# Patient Record
Sex: Male | Born: 1945 | Race: White | Hispanic: No | Marital: Married | Smoking: Never smoker
Health system: Southern US, Community
[De-identification: ages and names within clinical notes are randomized; demographics above are authoritative.]

## PROBLEM LIST (undated history)

## (undated) DIAGNOSIS — K635 Polyp of colon: Secondary | ICD-10-CM

## (undated) DIAGNOSIS — K219 Gastro-esophageal reflux disease without esophagitis: Secondary | ICD-10-CM

## (undated) DIAGNOSIS — E785 Hyperlipidemia, unspecified: Secondary | ICD-10-CM

## (undated) DIAGNOSIS — K449 Diaphragmatic hernia without obstruction or gangrene: Secondary | ICD-10-CM

## (undated) DIAGNOSIS — I7121 Aneurysm of the ascending aorta, without rupture: Secondary | ICD-10-CM

## (undated) DIAGNOSIS — I2699 Other pulmonary embolism without acute cor pulmonale: Secondary | ICD-10-CM

## (undated) DIAGNOSIS — R7303 Prediabetes: Secondary | ICD-10-CM

## (undated) DIAGNOSIS — I712 Thoracic aortic aneurysm, without rupture: Secondary | ICD-10-CM

## (undated) DIAGNOSIS — K4090 Unilateral inguinal hernia, without obstruction or gangrene, not specified as recurrent: Secondary | ICD-10-CM

## (undated) HISTORY — PX: HERNIA REPAIR: SHX51

## (undated) HISTORY — DX: Polyp of colon: K63.5

## (undated) HISTORY — DX: Other pulmonary embolism without acute cor pulmonale: I26.99

## (undated) HISTORY — DX: Aneurysm of the ascending aorta, without rupture: I71.21

## (undated) HISTORY — DX: Prediabetes: R73.03

## (undated) HISTORY — DX: Thoracic aortic aneurysm, without rupture: I71.2

## (undated) HISTORY — DX: Hyperlipidemia, unspecified: E78.5

---

## 1998-10-11 ENCOUNTER — Encounter: Payer: Self-pay | Admitting: Emergency Medicine

## 1998-10-11 ENCOUNTER — Emergency Department (HOSPITAL_COMMUNITY): Admission: EM | Admit: 1998-10-11 | Discharge: 1998-10-11 | Payer: Self-pay | Admitting: Emergency Medicine

## 2001-01-11 ENCOUNTER — Ambulatory Visit (HOSPITAL_COMMUNITY): Admission: RE | Admit: 2001-01-11 | Discharge: 2001-01-11 | Payer: Self-pay | Admitting: Gastroenterology

## 2001-01-11 ENCOUNTER — Encounter: Payer: Self-pay | Admitting: Gastroenterology

## 2004-06-09 ENCOUNTER — Other Ambulatory Visit: Payer: Self-pay

## 2005-03-19 ENCOUNTER — Encounter: Admission: RE | Admit: 2005-03-19 | Discharge: 2005-03-19 | Payer: Self-pay | Admitting: Family Medicine

## 2005-04-23 ENCOUNTER — Ambulatory Visit: Payer: Self-pay

## 2005-08-23 ENCOUNTER — Encounter: Admission: RE | Admit: 2005-08-23 | Discharge: 2005-08-23 | Payer: Self-pay | Admitting: Gastroenterology

## 2006-09-21 ENCOUNTER — Ambulatory Visit (HOSPITAL_COMMUNITY): Admission: RE | Admit: 2006-09-21 | Discharge: 2006-09-21 | Payer: Self-pay | Admitting: Surgery

## 2006-12-20 ENCOUNTER — Ambulatory Visit: Payer: Self-pay | Admitting: Cardiology

## 2006-12-30 ENCOUNTER — Ambulatory Visit: Payer: Self-pay

## 2007-01-17 ENCOUNTER — Ambulatory Visit: Payer: Self-pay | Admitting: Cardiology

## 2007-03-28 ENCOUNTER — Encounter: Admission: RE | Admit: 2007-03-28 | Discharge: 2007-03-28 | Payer: Self-pay | Admitting: Gastroenterology

## 2008-01-05 ENCOUNTER — Encounter: Admission: RE | Admit: 2008-01-05 | Discharge: 2008-01-05 | Payer: Self-pay | Admitting: Family Medicine

## 2009-12-12 DIAGNOSIS — R002 Palpitations: Secondary | ICD-10-CM | POA: Insufficient documentation

## 2009-12-12 DIAGNOSIS — R079 Chest pain, unspecified: Secondary | ICD-10-CM | POA: Insufficient documentation

## 2009-12-16 ENCOUNTER — Ambulatory Visit: Payer: Self-pay | Admitting: Cardiovascular Disease

## 2009-12-16 DIAGNOSIS — I739 Peripheral vascular disease, unspecified: Secondary | ICD-10-CM | POA: Insufficient documentation

## 2009-12-23 ENCOUNTER — Ambulatory Visit: Payer: Self-pay

## 2009-12-23 ENCOUNTER — Encounter: Payer: Self-pay | Admitting: Cardiovascular Disease

## 2010-12-15 NOTE — Assessment & Plan Note (Signed)
Summary: ec3/old Dr Diona Browner pt/jss   CC:  chest pressure also pt states he has pressure in left arm and one of his fingers he states has no circulation.  History of Present Illness: Howard Davenport is seen today to R/O vascular disease.  He has had numbness and pallor in the left index finger.  It is made worse with arms pulled in and cold.  He wears a glove to improve it.  He is a nonsmoker and has not had overt trauma.  However he was a Curator and plays the guitar and had stressed the palm of his hands and finger tips with these activities  The pallor and numbness are worse over the last 6 months.  He denies other vascular disease.  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 1/2 Tab By Mouth Once Daily 2)  Vitamin E 400 Unit Caps (Vitamin E) .Marland Kitchen.. 1 Tab By Mouth Once Daily 3)  Beta Sitosterol 40 % Powd (Beta Sitosterol) .Marland Kitchen.. 1 Tab By Mouth Once Daily 4)  Alfalfa 250 Mg Tabs (Alfalfa) .Marland Kitchen.. 1 Tab Op Once Daily 5)  Nifedipine 30 Mg Xr24h-Tab (Nifedipine) .... One Tablet By Mouth Once Daily  Allergies (verified): No Known Drug Allergies  Past History:  Past Medical History: Last updated: 12/12/2009 Current Problems:  CHEST PAIN (ICD-786.50) Patient of [facility].  Normal myuovue in 2008 PALPITATIONS (ICD-785.1)   previous history of erosive esophagitis as well as esophageal dilatation back in 2002  Past Surgical History: Last updated: 12/12/2009  status post herniorrhaphy.  Family History: Last updated: 12/12/2009   Noncontributory for premature cardiovascular disease  based on available information.  Social History: Last updated: 12/12/2009  Patient is married and he has 2 children.  He works for  [employer].  Denies any active tobacco  or alcohol use history.  Review of Systems       Denies fever, malais, weight loss, blurry vision, decreased visual acuity, cough, sputum, SOB, hemoptysis, pleuritic pain, palpitaitons, heartburn, abdominal pain, melena, lower extremity edema, claudication, or rash.   Vital Signs:  Patient profile:   65 year old male Height:      71 inches Weight:      215 pounds BMI:     30.09 Pulse rate:   64 / minute Resp:     12 per minute BP sitting:   124 / 79  (left arm)  Vitals Entered By: Kem Parkinson (December 16, 2009 10:18 AM)  Physical Exam  General:  Affect appropriate Healthy:  appears stated age HEENT: normal Neck supple with no adenopathy JVP normal no bruits no thyromegaly Lungs clear with no wheezing and good diaphragmatic motion Heart:  S1/S2 no murmur,rub, gallop or click PMI normal Abdomen: benighn, BS positve, no tenderness, no AAA no bruit.  No HSM or HJR Distal pulses intact with no bruits No edema Neuro non-focal Skin warm and dry    Impression & Recommendations:  Problem # 1:  PVD (ICD-443.9) Possible Raynauds.  Upper extremity duplex with position to R/O thoracic outlet syndrome.  Trial of Nifedipine XR  Problem # 2:  CHEST PAIN (ICD-786.50) Non recurrent normal ECG in office today His updated medication list for this problem includes:    Nifedipine 30 Mg Xr24h-tab (  Nifedipine) ..... One tablet by mouth once daily  Other Orders: Arterial Duplex Upper Extremity (Arterial Duplex Up )  Patient Instructions: 1)  Your physician recommends that you schedule a follow-up appointment in: AS NEEDED  2)  Your physician has recommended you make the following change in your medication: START NIFEDIPINE 30MG  ONE TABLET ONCE DAILY 3)  Your physician has requested that you have a lower or upper extremity arterial duplex.  This test is an ultrasound of the arteries in the legs or arms.  It looks at arterial blood flow in the legs and arms.  Allow one hour for Lower  and Upper Arterial scans. There are no restrictions or special instructions. Prescriptions: NIFEDIPINE 30 MG XR24H-TAB (NIFEDIPINE) ONE TABLET BY MOUTH ONCE DAILY  #30 x 12   Entered by:   Deliah Goody, RN   Authorized by:   Colon Branch, MD, [facility]   Signed by:   Deliah Goody, RN on 12/16/2009   Method used:   Electronically to        [facility]  [address] [id number]       [address]       [place], [place]  [address]       Ph: [phone]       Fax: [phone]   RxID:   [id number]    EKG Report  Procedure date:  12/16/2009  Findings:      NSR 64 Normal ECG

## 2011-04-02 NOTE — Op Note (Signed)
NAME:  Howard Davenport, Howard Davenport                 ACCOUNT NO.:  [id number]   MEDICAL RECORD NO.:  [id number]          PATIENT TYPE:  AMB   LOCATION:  SDS                          FACILITY:  [facility]   PHYSICIAN:  Thomas A. Cornett, M.D.DATE OF BIRTH:  05-18-1946   DATE OF PROCEDURE:  09/21/2006  DATE OF DISCHARGE:                                 OPERATIVE REPORT   PREOPERATIVE DIAGNOSIS:  Right inguinal hernia.   POSTOPERATIVE DIAGNOSIS:  Direct right inguinal hernia.   PROCEDURE:  Right inguinal hernia repair with mesh.   SURGEON:  Thomas Cornett.   ANESTHESIA:  LMA with 20 mL of 0.25% plain Sensorcaine.   ESTIMATED BLOOD LOSS:  10 mL.   SPECIMENS:  None.   INDICATIONS FOR PROCEDURE:  The patient is a 65 year old male with an  enlarging right inguinal hernia that was becoming symptomatic.  He wished to  have it repaired.  He presents today for that after discussion of the  procedure, complications, risks, and long-term outcomes and expectations.   DESCRIPTION OF PROCEDURE:  The patient was brought to the operating room,  placed supine.  After induction of LMA anesthesia, the right inguinal region  was prepped and draped in sterile fashion.  I infiltrated the skin with  0.25% plain Sensorcaine in the right inguinal crease and made an oblique  incision over the right inguinal canal.  Dissection was carried down through  Scarpa fascia.  The superficial epigastric vessels were ligated with 2-0  Vicryl.  Once we got down to the aponeurosis of the external oblique, I  infiltrated this with 0.25% Sensorcaine.  This was then opened in the  direction of its fibers toward the external ring.  Once we opened this, I  was able to encircle the cord structures with half-inch Penrose drain.  There was a direct hernia and I was able to dissect this off the cord.  There was also a large lipoma which I dissected off the cord as well.  I  went ahead and just reduced the lipoma back in the preperitoneal space.   The  floor of the inguinal canal was for the most part gone.  A large Prolene  hernia system was brought on the field.  I was able to sweep under the  muscles in the preperitoneal space and create a space for the large Prolene  hernia system inner leaflet.  This was then placed.  The mesh was then swept  down to the pubic tubercle superiorly and then laterally down to the  shelving edge of the inguinal ligament.  Once this was done, I secured the  mesh to the transversus abdominis muscle with a zero Vicryl at the connector  piece.  Then I tacked the onlay piece down to the pubic tubercle with a zero  Vicryl.  Number one Novofils were used to secure the onlay piece of mesh  circumferentially to the shelving edge of inguinal ligament and over to the  contouring tendon.  There was a small slit cut for the cord structures and I  sutured the mesh around the cord structures with #1  Novofil.  The  ilioinguinal nerve was sacrificed to keep it off the mesh since it lied in a  position.  I was concerned for possible postop neuralgia.  Also, the medial  branch of the iliohypogastric nerve as well lie directly in contact with the  mesh.  This was divided for this reason as well.  I was able to insert the  tip of my fifth digit into the new internal ring.  Irrigation was used and  suctioned out.  2-0 Vicryl was used to close the aponeurosis of the external  oblique in a  running fashion.  I used a 2-0 Vicryl to approximate Scarpa fascia and 4-0  Monocryl in a subcuticular fashion for the skin.  Steri-Strips and dry  dressings were applied.  All final counts of sponge, needle and instruments  were found to be correct for this portion of the case.  The patient was  awoke and taken to recovery in satisfactory condition.      Thomas A. Cornett, M.D.  Electronically Signed     TAC/MEDQ  D:  09/21/2006  T:  09/21/2006  Job:  [id number]   cc:   Ernestina Penna, M.D.

## 2011-04-02 NOTE — Assessment & Plan Note (Signed)
[facility]                            CARDIOLOGY OFFICE NOTE   NAME:CROSSFitz, Matsuo                        MRN:          [id number]  DATE:01/17/2007                            DOB:          1946/04/15    REASON FOR VISIT:  Follow up stress testing.   HISTORY OF PRESENT ILLNESS:  I saw Mr. Fesler back in early February.  His history is detailed in my previous note.  He had been experiencing  some symptoms of a general feeling of coldness in his left arm with  activity, and sometimes in the cold, as well as atypical chest pain, and  a single episode of left shoulder discomfort.  His resting  electrocardiogram was normal, and he had a prior history of reportedly  reassuring exercise treadmill testing in 2006.  I discussed diagnostic  options with him, including both invasive and noninvasive techniques,  and he elected to proceed with an exercise Myoview, which we performed  on the 15th of February.  This study showed no significant reversible  perfusion defects to suggest focal ischemia with a normal transient  ischemic dilatation ratio of 0.95.  His ejection fraction was 62%.  On  review of his electrocardiographic tracings, however, he did have  equivocal ST segment changes, and a 4-beat run of nonsustained  ventricular tachycardia near peak.  I discussed these results with him  in detail today.   He tells me that he has had trouble with palpitations in the past when  he drinks tea, but no history of dizziness or syncope.  He states that  he has been drinking more tea lately and has been having some  palpitations, wondering whether the arrhythmia that we saw was related.  I explained to him that this is certainly possible, although it can  sometimes be seen in the setting of ischemic heart disease.  I also  discussed the fact that his Myoview could have potentially missed some  underlying coronary atherosclerosis, and that confirmatory testing via a  cardiac catheterization would be a consideration.  At this point, he  considered the matter, and was not interested in any additional cardiac  studies, including a cardiac catheterization or even an event recorder  given his palpitations.  He stated that he was most comfortable with  simple observation, and that he would follow up with his primary care  Ricard Faulkner.  I did ask him to inform us if he developed any worsening  symptoms or changed his mind.   ALLERGIES:  NO KNOWN DRUG ALLERGIES.   PRESENT MEDICATIONS:  1. Prilosec 20 mg p.o. daily.  2. Multivitamin 1 p.o. daily.  3. Glucosamine as directed.   REVIEW OF SYSTEMS:  As discussed in history of present illness.   EXAMINATION:  Blood pressure 126/54.  Heart rate is 67.  Weight is 213  pounds.  Patient is comfortable, in no acute distress.  No significant change in baseline examination.   IMPRESSION AND RECOMMENDATIONS:  1. Previous symptoms as discussed and outlined above.  As yet,      etiology is not certain.  He does have a history of erosive      esophagitis, which certainly may be related.  He also wondered      whether Raynaud's could be considered, although he is not      describing any symmetrical symptoms in both extremities, and for      that matter, no specific changes with cold at rest or color changes      in the hands.  I discussed the results of his stress testing in      detail, and still have some concerns based on his 4-beat      nonsustained ventricular tachycardia, although his perfusion      imaging was fairly reassuring.  He is not interested in any further      testing at this point, and is most comfortable with observation.  I      have asked him to let us know if he has a change in symptoms, or      would like to proceed with further evaluation.  I think an event      recorder would be reasonable, given his history of palpitations,      and his electrocardiographic findings.  One could also argue for       proceeding with a cardiac catheterization to clearly define the      coronary anatomy, and settle the issue more completely.  We will be      available if he wishes to pursue this further.  2. Otherwise, continue followup with [facility].     Jonelle Sidle, MD  Electronically Signed    SGM/MedQ  DD: 01/17/2007  DT: 01/17/2007  Job #: [id number]

## 2011-04-02 NOTE — Assessment & Plan Note (Signed)
[facility]                            CARDIOLOGY OFFICE NOTE   NAME:CROSSSoua, Caltagirone                        MRN:          [id number]  DATE:12/20/2006                            DOB:          08-09-1946    REFERRING PHYSICIAN:  [facility]   REASON FOR CONSULTATION:  Left arm discomfort and chest pain.   HISTORY OF PRESENT ILLNESS:  Mr. Sagar is a pleasant 65 year old male  with no reported cardiovascular disease.  I see a previous history of  erosive esophagitis as well as esophageal dilatation back in 2002, and  prior cardiac stress testing via an exercise treadmill test in 2006,  which was negative or ischemia.  He states that he typically walks with  his wife approximately 3 or 4 miles at a time every other day, and  typically tolerates this well.  Within the last month, he has  experienced an unusual feeling of coldness in his left arm and hand  sometimes when he walks.  He also states that he had an episode of left  shoulder discomfort at one point that he cannot relate to any specific  event or injury.  He has had an unusual focal pain his left chest as if  a thumb is pushed into his chest, and this is also a new symptom for  him.  He became more concerned and wanted to be evaluated further.  Otherwise, he reports no major symptoms with exertion.  No limiting  dyspnea.  No palpitations.  No syncope.   His resting electrocardiogram today is normal, showing sinus rhythm at  67 beats per minute.   ALLERGIES:  NO KNOWN DRUG ALLERGIES.   PRESENT MEDICATIONS:  1. Prilosec 20 mg p.o. daily.  2. Multivitamin.  3. Glucosamine.   PAST MEDICAL HISTORY:  Is as outlined in the history of present illness.  He states that he is status post herniorrhaphy.   REVIEW OF SYSTEMS:  As described in history of present illness.  He is  not having any problems with dysphagia, although this was a problem in  the past.  He does note some right upper  quadrant discomfort at times  when he eats fatty foods.  He has no claudication.  Denies any lower  extremity edema.  No has no orthopnea or PND.   SOCIAL HISTORY:  Patient is married and he has 2 children.  He works for  [employer].  Denies any active tobacco or alcohol use history.   FAMILY HISTORY:  Noncontributory for premature cardiovascular disease  based on available information.   EXAMINATION:  Blood pressure is 122/80.  Heart rate is 67.  Weight is  212 pounds.  The patient is comfortable.  Normally nourished.  In no acute distress.  HEENT:  Conjunctivae is normal.  Oropharynx is clear.  NECK:  Supple without obvious jugular venous pressure or bruits.  No  thyromegaly is noted.  LUNGS:  Clear without labored breathing at rest.  CARDIAC:  Exam reveals a regular rate and rhythm without loud murmur,  S3, gallop or pericardial rub.  ABDOMEN:  Soft and non-tender.  Normoactive bowel sounds.  No guarding  on today's examination.  EXTREMITIES:  Show no pitting edema.  Distal pulses are 2+ including  radials bilaterally.  SKIN:  Warm and dry.  MUSCULOSKELETAL:  No kyphosis is noted.  NEUROLOGIC/PSYCHIATRIC:  Patient is alert and oriented x3.   IMPRESSION AND RECOMMENDATIONS:  1. Symptom complex as discussed above including a feeling of a      relative coldness of the left arm with activity, atypical chest      pain and an episode of left shoulder discomfort.  The resting      electrocardiogram is normal.  I discussed with Mr. Rinck today      followup ischemic evaluation.  We discussed both noninvasive and      invasive techniques, and at this point, he has elected to proceed      with an exercise Myoview.  I will then have him follow up in the      office to discuss the results, and we can proceed from there.  2. Further plans to follow.     Jonelle Sidle, MD  Electronically Signed    SGM/MedQ  DD: 12/20/2006  DT: 12/20/2006  Job #: [id number]   cc:    [facility]

## 2013-03-29 ENCOUNTER — Ambulatory Visit: Payer: Medicare Other | Admitting: Physician Assistant

## 2013-03-30 ENCOUNTER — Ambulatory Visit (INDEPENDENT_AMBULATORY_CARE_PROVIDER_SITE_OTHER): Payer: Medicare Other | Admitting: Physician Assistant

## 2013-03-30 ENCOUNTER — Encounter: Payer: Self-pay | Admitting: Physician Assistant

## 2013-03-30 VITALS — BP 126/90 | HR 68 | Temp 97.3°F | Resp 18 | Wt 212.0 lb

## 2013-03-30 DIAGNOSIS — T148 Other injury of unspecified body region: Secondary | ICD-10-CM

## 2013-03-30 DIAGNOSIS — W57XXXA Bitten or stung by nonvenomous insect and other nonvenomous arthropods, initial encounter: Secondary | ICD-10-CM

## 2013-03-30 MED ORDER — DOXYCYCLINE HYCLATE 100 MG PO TABS: 100.0000 mg | ORAL_TABLET | Freq: Two times a day (BID) | ORAL | Status: DC

## 2013-03-30 MED ORDER — DOXYCYCLINE HYCLATE 100 MG PO TABS: 100.0000 mg | ORAL_TABLET | Freq: Two times a day (BID) | ORAL | Status: AC

## 2013-03-30 NOTE — Progress Notes (Signed)
   Patient ID: Howard Davenport MRN: [id number], DOB: 04-29-1946, 67 y.o. Date of Encounter: 03/30/2013, 3:13 PM    Chief Complaint:  Chief Complaint  Patient presents with  . concern about reaction to tick bite  h/o RMSF     HPI: 67 y.o. year old white male is here for evaluation secondary to recent tic bites.  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 Last one was about 1- 1 1/2 weeks ago. That one caused a pink spot on his skin "the size of a quarter" at the site.  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 At that time, his only symptom was joint pains.  He is currently having joint pains. He says these may be to regular "aches and pains" but he wanted to make sur eit wasn't RMSF like before." He has had no other rash other than the pink area at site of tic (see above). He has had no fever. No malaise. No headache.   Home Meds: See attached medication section for any medications that were entered at today's visit. The computer does not put those onto this list.The following list is a list of meds entered prior to today's visit.   No current outpatient prescriptions on file prior to visit.   No current facility-administered medications on file prior to visit.    Allergies: No Known Allergies    Review of Systems: See HPI for pertinent ROS. All other ROS negative.    Physical Exam: Blood pressure 126/90, pulse 68, temperature 97.3 F (36.3 C), temperature source Oral, resp. rate 18, weight 212 lb (96.163 kg)., Body mass index is 29.58 kg/(m^2). General:Very Pleasant WM.  Appears in no acute distress. Neck: Supple. No thyromegaly. No lymphadenopathy. Lungs: Clear bilaterally to auscultation without wheezes, rales, or rhonchi. Breathing is unlabored. Heart: Regular rhythm. No murmurs, rubs, or gallops. Msk:  Strength and tone normal for age. Extremities/Skin: Warm and dry. Left Groin: one inch diameter area of light pink diffuse erythema. Not raised.  No central clearing/target lesion/bulls eye lesion. No other area of erythema or rash. Neuro: Alert and oriented X 3. Moves all extremities spontaneously. Gait is normal. CNII-XII grossly in tact. Psych:  Responds to questions appropriately with a normal affect.     ASSESSMENT AND PLAN:  67 y.o. year old male with  1. Tick bite Will check lab for Lyme, RMSF but will go ahead and start empiric treatment. He is experiencing same symptoms he had with prior RMSF. - B. burgdorfi antibodies by WB - Rocky mtn spotted fvr abs pnl(IgG+IgM) - doxycycline (VIBRA-TABS) 100 MG tablet; Take 1 tablet (100 mg total) by mouth 2 (two) times daily.  Dispense: 42 tablet; Refill: 0   Signed, [address] [place], [place], [facility] 03/30/2013 3:13 PM

## 2013-04-02 LAB — ROCKY MTN SPOTTED FVR ABS PNL(IGG+IGM): RMSF IgM: 0.37 IV

## 2013-08-23 ENCOUNTER — Ambulatory Visit (INDEPENDENT_AMBULATORY_CARE_PROVIDER_SITE_OTHER): Payer: Medicare Other | Admitting: *Deleted

## 2013-08-23 VITALS — Temp 98.7°F

## 2013-08-23 DIAGNOSIS — Z23 Encounter for immunization: Secondary | ICD-10-CM

## 2013-08-30 DIAGNOSIS — M25511 Pain in right shoulder: Secondary | ICD-10-CM | POA: Insufficient documentation

## 2013-12-22 ENCOUNTER — Encounter (HOSPITAL_COMMUNITY): Payer: Self-pay | Admitting: Emergency Medicine

## 2013-12-22 ENCOUNTER — Emergency Department (HOSPITAL_COMMUNITY)
Admission: EM | Admit: 2013-12-22 | Discharge: 2013-12-22 | Disposition: A | Discharge status: Home / Self Care | Payer: Medicare HMO | Attending: Emergency Medicine | Admitting: Emergency Medicine

## 2013-12-22 DIAGNOSIS — Z79899 Other long term (current) drug therapy: Secondary | ICD-10-CM | POA: Insufficient documentation

## 2013-12-22 DIAGNOSIS — R110 Nausea: Secondary | ICD-10-CM | POA: Insufficient documentation

## 2013-12-22 DIAGNOSIS — Z8719 Personal history of other diseases of the digestive system: Secondary | ICD-10-CM | POA: Insufficient documentation

## 2013-12-22 DIAGNOSIS — R55 Syncope and collapse: Secondary | ICD-10-CM | POA: Insufficient documentation

## 2013-12-22 HISTORY — DX: Unilateral inguinal hernia, without obstruction or gangrene, not specified as recurrent: K40.90

## 2013-12-22 HISTORY — DX: Diaphragmatic hernia without obstruction or gangrene: K44.9

## 2013-12-22 LAB — CBC WITH DIFFERENTIAL/PLATELET
BASOS PCT: 0 % (ref 0–1)
Basophils Absolute: 0 10*3/uL (ref 0.0–0.1)
Eosinophils Absolute: 0.1 10*3/uL (ref 0.0–0.7)
Eosinophils Relative: 2 % (ref 0–5)
HEMATOCRIT: 42.9 % (ref 39.0–52.0)
HEMOGLOBIN: 15.2 g/dL (ref 13.0–17.0)
Lymphocytes Relative: 42 % (ref 12–46)
Lymphs Abs: 2.9 10*3/uL (ref 0.7–4.0)
MCH: 31.5 pg (ref 26.0–34.0)
MCHC: 35.4 g/dL (ref 30.0–36.0)
MCV: 88.8 fL (ref 78.0–100.0)
MONO ABS: 0.6 10*3/uL (ref 0.1–1.0)
MONOS PCT: 8 % (ref 3–12)
NEUTROS ABS: 3.3 10*3/uL (ref 1.7–7.7)
Neutrophils Relative %: 48 % (ref 43–77)
Platelets: 180 10*3/uL (ref 150–400)
RBC: 4.83 MIL/uL (ref 4.22–5.81)
RDW: 12.8 % (ref 11.5–15.5)
WBC: 6.9 10*3/uL (ref 4.0–10.5)

## 2013-12-22 LAB — BASIC METABOLIC PANEL
BUN: 12 mg/dL (ref 6–23)
CHLORIDE: 98 meq/L (ref 96–112)
CO2: 26 meq/L (ref 19–32)
Calcium: 8.8 mg/dL (ref 8.4–10.5)
Creatinine, Ser: 0.89 mg/dL (ref 0.50–1.35)
GFR calc non Af Amer: 87 mL/min — ABNORMAL LOW (ref >90)
GLUCOSE: 150 mg/dL — AB (ref 70–99)
POTASSIUM: 3.8 meq/L (ref 3.7–5.3)
SODIUM: 138 meq/L (ref 137–147)

## 2013-12-22 LAB — POCT I-STAT TROPONIN I
TROPONIN I, POC: 0 ng/mL (ref 0.00–0.08)
TROPONIN I, POC: 0 ng/mL (ref 0.00–0.08)
Troponin i, poc: 0.01 ng/mL (ref 0.00–0.08)

## 2013-12-22 LAB — GLUCOSE, CAPILLARY: GLUCOSE-CAPILLARY: 142 mg/dL — AB (ref 70–99)

## 2013-12-22 LAB — D-DIMER, QUANTITATIVE: D-Dimer, Quant: <0.27 ug/mL-FEU (ref 0.00–0.48)

## 2013-12-22 NOTE — ED Notes (Signed)
Pt came into ED.  Stated he thought he was having heart attack.  Pt then became weak, red faced and stated that he was about to pass out.  Pt placed in chair and taken back to [facility]

## 2013-12-22 NOTE — ED Notes (Signed)
Pt states that he has been "feeling strange" x 3 days.  Was at lunch with his family this afternoon and told his family, "If anything happens to me, I need you to know that I feel very strange". States that he feels nauseated and feels like he "is about to go out".  Denies any pain.

## 2013-12-22 NOTE — ED Provider Notes (Signed)
CSN: [id number]     Arrival date & time 12/22/13  1334 History   First MD Initiated Contact with Patient 12/22/13 1349     Chief Complaint  Patient presents with  . Nausea  . Near Syncope   (Consider location/radiation/quality/duration/timing/severity/associated sxs/prior Treatment) HPI 68 year old healthy male presents with 30-45 minutes of waxing and waning pre-syncope, he states for several minutes at a time he will feel generally weak lightheaded slight nausea slight sweats and feels that he is going to faint without chest pain shortness of breath palpitations syncope amnesia headache or change in speech vision swallowing or understanding, he also is no focal or lateralizing weakness numbness or incoordination, there is no trauma or headache, he has a history of good exercise tolerance and walks briskly over 2 miles at a time without difficulty recently, he does not have any history of recurrent syncope and is only episode of syncope in the past was when he had vertigo years ago, he does not have any history of documented coronary artery disease or heart failure. There is no treatment prior to arrival. He has no fever cough or shortness of breath however he states over the last few days he has a sensation that he cannot take a deep breath he noticed painless and does not disrupted his activity in any way. Has no history of blood clots or cancer. He is no recent immobilization. Past Medical History  Diagnosis Date  . Hiatal hernia   . Inguinal hernia    Past Surgical History  Procedure Laterality Date  . Hernia repair     History reviewed. No pertinent family history. History  Substance Use Topics  . Smoking status: Never Smoker   . Smokeless tobacco: Not on file  . Alcohol Use: No    Review of Systems 10 Systems reviewed and are negative for acute change except as noted in the HPI. Allergies  Review of patient's allergies indicates no known allergies.  Home Medications   Current  Outpatient Rx  Name  Route  Sig  Dispense  Refill  . lansoprazole (PREVACID) 15 MG capsule   Oral   Take 15 mg by mouth daily at 12 noon.         Marland Kitchen omega-3 acid ethyl esters (LOVAZA) 1 G capsule   Oral   Take 1 g by mouth daily.         . Probiotic Product (PROBIOTIC DAILY PO)   Oral   Take 1 tablet by mouth daily.         . vitamin E 400 UNIT capsule   Oral   Take 400 Units by mouth daily.          BP 129/72  Pulse 70  Temp(Src) 98.2 F (36.8 C) (Oral)  Resp 16  SpO2 99% Physical Exam  Nursing note and vitals reviewed. Constitutional:  Awake, alert, nontoxic appearance.  HENT:  Head: Atraumatic.  Eyes: Right eye exhibits no discharge. Left eye exhibits no discharge.  Neck: Neck supple.  Cardiovascular: Normal rate and regular rhythm.   No murmur heard. Pulmonary/Chest: Effort normal and breath sounds normal. No respiratory distress. He has no wheezes. He has no rales. He exhibits no tenderness.  Abdominal: Soft. Bowel sounds are normal. He exhibits no distension and no mass. There is no tenderness. There is no rebound and no guarding.  Musculoskeletal: He exhibits no edema and no tenderness.  Baseline ROM, no obvious new focal weakness.  Neurological: He is alert.  Mental status and motor  strength appears baseline for patient and situation.  Skin: No rash noted.  Psychiatric: He has a normal mood and affect.    ED Course  Procedures (including critical care time) Feel Pt low risk for cardiac syncope and low risk for PE. Doubt ACS, CVA, SAH, SBI, aortic dissection.  Pt stable in ED with no significant deterioration in condition.Patient / Family / Caregiver understand and agree with initial ED impression and plan with expectations set for ED visit.Await repeat troponins. [place] Reviewed  GLUCOSE, CAPILLARY - Abnormal; Notable for the following:    Glucose-Capillary 142 (*)    All other components within normal limits  BASIC METABOLIC PANEL  - Abnormal; Notable for the following:    Glucose, Bld 150 (*)    GFR calc non Af Amer 87 (*)    All other components within normal limits  CBC WITH DIFFERENTIAL  D-DIMER, QUANTITATIVE  POCT I-STAT TROPONIN I  POCT I-STAT TROPONIN I   Imaging Review No results found.  EKG Interpretation    Date/Time:  Saturday December 22 2013 14:23:45 EST Ventricular Rate:  68 PR Interval:  212 QRS Duration: 100 QT Interval:  396 QTC Calculation: 421 R Axis:   52 Text Interpretation:  Sinus rhythm Borderline prolonged PR interval Low voltage, precordial leads Consider anterior infarct Baseline wander in lead(s) V3 No significant change since last tracing Confirmed by [facility]  MD, Leilan Bochenek ([id number]) on 12/22/2013 2:31:29 PM            MDM   1. Near syncope    Dispo pending.    Babette Relic, MD 12/22/13 2018

## 2013-12-22 NOTE — Discharge Instructions (Signed)
Your caregiver has seen you today because you are having problems with feelings of weakness, dizziness, and/or fatigue. Weakness has many different causes, some of which are common and others are very rare. Your caregiver has considered some of the most common causes of weakness and feels it is safe for you to go home and be observed. Not every illness or injury can be identified during an emergency department visit, thus follow-up with your primary healthcare provider is important. Medical conditions can also worsen, so it is also important to return immediately as directed below, or if you have other serious concerns develop. RETURN IMMEDIATELY IF you develop new shortness of breath, chest pain, fever, have difficulty moving parts of your body (new weakness, numbness, or incoordination), sudden change in speech, vision, swallowing, or understanding, faint or develop new dizziness, severe headache, become poorly responsive or have an altered mental status compared to baseline for you, new rash, abdominal pain, or bloody stools,  Return sooner also if you develop new problems for which you have not talked to your caregiver but you feel may be emergency medical conditions, or are unable to be cared for safely at home.  

## 2013-12-26 ENCOUNTER — Encounter: Payer: Self-pay | Admitting: Family Medicine

## 2013-12-26 ENCOUNTER — Ambulatory Visit (INDEPENDENT_AMBULATORY_CARE_PROVIDER_SITE_OTHER): Payer: Commercial Managed Care - HMO | Admitting: Family Medicine

## 2013-12-26 VITALS — BP 108/70 | HR 78 | Temp 98.3°F | Resp 18 | Ht 72.0 in | Wt 212.0 lb

## 2013-12-26 DIAGNOSIS — R55 Syncope and collapse: Secondary | ICD-10-CM

## 2013-12-26 NOTE — Progress Notes (Signed)
Subjective:    Patient ID: Howard Davenport, male    DOB: [birth year], 68 y.o.   MRN: [id number]  HPI Patient went to the emergency room 2/7 with near syncope.  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 He was not doing strenuous work. He had eaten normal meals. He went inside for supper and felt unsteady.  When he sat down at the table he began to feel hot and flushed. He broke out in a cold sweat. At that time he felt like he was going to pass out. He denied any chest pain, shortness of breath, dyspnea on exertion. He had no syncopal episode. He had no seizure-like activity. His wife took of the emergency room were he resolves the pain is laying in route. When he stood to get out of the car he again felt lightheaded and near syncopal. In the emergency room workup included 8 hours of monitoring on telemetry which was benign, and EKG which I reviewed today in clinic, normal troponins, CBC, and CMP. Patient's evaluation in the emergency room was normal. He was discharged with followup here. Since that time he has felt completely normal. He denies any palpitations or arrhythmias. He denies any chest pain or angina. He denies any dyspnea on exertion. He exercises by walking 2 miles every day without any dyspnea or chest pain. He has no history of cardiac murmur or congestive heart failure. He denies any orthopnea or PND.   Past Medical History  Diagnosis Date  . Hiatal hernia   . Inguinal hernia    Past Surgical History  Procedure Laterality Date  . Hernia repair     Current Outpatient Prescriptions on File Prior to Visit  Medication Sig Dispense Refill  . lansoprazole (PREVACID) 15 MG capsule Take 15 mg by mouth daily at 12 noon.      Marland Kitchen omega-3 acid ethyl esters (LOVAZA) 1 G capsule Take 1 g by mouth daily.      . Probiotic Product (PROBIOTIC DAILY PO) Take 1 tablet by mouth daily.      . vitamin E 400 UNIT capsule Take 400 Units by mouth daily.       No current facility-administered medications  on file prior to visit.   No Known Allergies History   Social History  . Marital Status: Married    Spouse Name: N/A    Number of Children: N/A  . Years of Education: N/A   Occupational History  . Not on file.   Social History Main Topics  . Smoking status: Never Smoker   . Smokeless tobacco: Not on file  . Alcohol Use: No  . Drug Use: No  . Sexual Activity: Not on file   Other Topics Concern  . Not on file   Social History Narrative  . No narrative on file   No family history on file.    Review of Systems  All other systems reviewed and are negative.       Objective:   Physical Exam  Vitals reviewed. Constitutional: He is oriented to person, place, and time. He appears well-developed and well-nourished. No distress.  HENT:  Right Ear: External ear normal.  Left Ear: External ear normal.  Nose: Nose normal.  Mouth/Throat: Oropharynx is clear and moist.  Eyes: Conjunctivae and EOM are normal. Pupils are equal, round, and reactive to light. No scleral icterus.  Neck: Neck supple. No JVD present.  Cardiovascular: Normal rate, regular rhythm and normal heart sounds.  Exam reveals no  gallop and no friction rub.   No murmur heard. Pulmonary/Chest: Effort normal and breath sounds normal. No respiratory distress. He has no wheezes. He has no rales.  Abdominal: Soft. Bowel sounds are normal.  Musculoskeletal: He exhibits no edema.  Neurological: He is alert and oriented to person, place, and time. He displays normal reflexes. No cranial nerve deficit. He exhibits normal muscle tone. Coordination normal.  Skin: He is not diaphoretic.  Psychiatric: He has a normal mood and affect. His behavior is normal. Judgment and thought content normal.          Assessment & Plan:  1. Vasovagal near syncope I believe the patient had vasovagal near syncope.  I believe this is likely an isolated situation that will not repeat itself. Offered the patient 2-D echocardiogram to  rule out cardiomyopathy and a 24-hour Holter monitor to watch for cardiac arrhythmias.I also offered the patient a cardiology referral for a stress test. The patient would like to defer this testing for now and monitor clinically. If the symptoms return he would consent for that testing. At the present time he is comfortable with this diagnosis and again elects to proceed with clinical surveilance.

## 2014-01-15 ENCOUNTER — Other Ambulatory Visit: Payer: Self-pay | Admitting: Family Medicine

## 2014-01-15 ENCOUNTER — Encounter: Payer: Self-pay | Admitting: Family Medicine

## 2014-01-15 ENCOUNTER — Ambulatory Visit (INDEPENDENT_AMBULATORY_CARE_PROVIDER_SITE_OTHER): Payer: Commercial Managed Care - HMO | Admitting: Family Medicine

## 2014-01-15 VITALS — BP 122/74 | HR 56 | Temp 96.8°F | Resp 18 | Ht 70.0 in | Wt 217.0 lb

## 2014-01-15 DIAGNOSIS — R55 Syncope and collapse: Secondary | ICD-10-CM

## 2014-01-15 NOTE — Progress Notes (Signed)
Subjective:    Patient ID: Howard Davenport, male    DOB: Jul 15, 1946, 68 y.o.   MRN: [id number]  HPI 12/26/13 Patient went to the emergency room 2/7 with near syncope.  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 He was not doing strenuous work. He had eaten normal meals. He went inside for supper and felt unsteady.  When he sat down at the table he began to feel hot and flushed. He broke out in a cold sweat. At that time he felt like he was going to pass out. He denied any chest pain, shortness of breath, dyspnea on exertion. He had no syncopal episode. He had no seizure-like activity. His wife took of the emergency room were he resolves the pain is laying in route. When he stood to get out of the car he again felt lightheaded and near syncopal. In the emergency room workup included 8 hours of monitoring on telemetry which was benign, and EKG which I reviewed today in clinic, normal troponins, CBC, and CMP. Patient's evaluation in the emergency room was normal. He was discharged with followup here. Since that time he has felt completely normal. He denies any palpitations or arrhythmias. He denies any chest pain or angina. He denies any dyspnea on exertion. He exercises by walking 2 miles every day without any dyspnea or chest pain. He has no history of cardiac murmur or congestive heart failure. He denies any orthopnea or PND.  At that time, my plan was: 1. Vasovagal near syncope I believe the patient had vasovagal near syncope.  I believe this is likely an isolated situation that will not repeat itself. Offered the patient 2-D echocardiogram to rule out cardiomyopathy and a 24-hour Holter monitor to watch for cardiac arrhythmias.I also offered the patient a cardiology referral for a stress test. The patient would like to defer this testing for now and monitor clinically. If the symptoms return he would consent for that testing. At the present time he is comfortable with this diagnosis and again elects to  proceed with clinical surveilance.   01/15/14 Patient continues to have lightheadedness/dizzy spells.  He denies any vertigo. He denies any hearing loss or tinnitus. He states that he feels like he may pass out. He feels better when he is in a cold environment. The episodes seem to happen worse when he is in a warm environment.  He denies any palpitations, chest pain, shortness of breath, dyspnea on exertion. He denies any angina Past Medical History  Diagnosis Date  . Hiatal hernia   . Inguinal hernia    Past Surgical History  Procedure Laterality Date  . Hernia repair     Current Outpatient Prescriptions on File Prior to Visit  Medication Sig Dispense Refill  . lansoprazole (PREVACID) 15 MG capsule Take 15 mg by mouth daily at 12 noon.      . Probiotic Product (PROBIOTIC DAILY PO) Take 1 tablet by mouth daily.      . vitamin E 400 UNIT capsule Take 400 Units by mouth daily.       No current facility-administered medications on file prior to visit.   No Known Allergies History   Social History  . Marital Status: Married    Spouse Name: N/A    Number of Children: N/A  . Years of Education: N/A   Occupational History  . Not on file.   Social History Main Topics  . Smoking status: Never Smoker   . Smokeless tobacco: Not on  file  . Alcohol Use: No  . Drug Use: No  . Sexual Activity: Not on file   Other Topics Concern  . Not on file   Social History Narrative  . No narrative on file   No family history on file.    Review of Systems  All other systems reviewed and are negative.       Objective:   Physical Exam  Vitals reviewed. Constitutional: He is oriented to person, place, and time. He appears well-developed and well-nourished. No distress.  HENT:  Right Ear: External ear normal.  Left Ear: External ear normal.  Nose: Nose normal.  Mouth/Throat: Oropharynx is clear and moist.  Eyes: Conjunctivae and EOM are normal. Pupils are equal, round, and reactive to  light. No scleral icterus.  Neck: Neck supple. No JVD present.  Cardiovascular: Normal rate, regular rhythm and normal heart sounds.  Exam reveals no gallop and no friction rub.   No murmur heard. Pulmonary/Chest: Effort normal and breath sounds normal. No respiratory distress. He has no wheezes. He has no rales.  Abdominal: Soft. Bowel sounds are normal.  Musculoskeletal: He exhibits no edema.  Neurological: He is alert and oriented to person, place, and time. He displays normal reflexes. No cranial nerve deficit. He exhibits normal muscle tone. Coordination normal.  Skin: He is not diaphoretic.  Psychiatric: He has a normal mood and affect. His behavior is normal. Judgment and thought content normal.          Assessment & Plan:  Near syncope - Plan: COMPLETE METABOLIC PANEL WITH GFR, TSH, CBC with Differential, Ambulatory referral to Cardiology  I believe the patient is having vasovagal presyncope.  Consult cardiology for a stress test, echocardiogram and possibly a tilt table test.  I reassured the patient that I do not find any pathologic findings on his exam. I do not think this is vertigo.  Also check a CBC, CMP, and TSH.  The patient denies any anxiety. He does not feel that these are panic attacks.

## 2014-01-16 LAB — COMPLETE METABOLIC PANEL WITH GFR
ALK PHOS: 60 U/L (ref 39–117)
ALT: 15 U/L (ref 0–53)
AST: 14 U/L (ref 0–37)
Albumin: 4.3 g/dL (ref 3.5–5.2)
BILIRUBIN TOTAL: 0.4 mg/dL (ref 0.2–1.2)
BUN: 15 mg/dL (ref 6–23)
CHLORIDE: 102 meq/L (ref 96–112)
CO2: 27 mEq/L (ref 19–32)
CREATININE: 0.82 mg/dL (ref 0.50–1.35)
Calcium: 9 mg/dL (ref 8.4–10.5)
GFR, Est African American: >89 mL/min
GFR, Est Non African American: >89 mL/min
Glucose, Bld: 118 mg/dL — ABNORMAL HIGH (ref 70–99)
Potassium: 4 mEq/L (ref 3.5–5.3)
Sodium: 138 mEq/L (ref 135–145)
Total Protein: 6.5 g/dL (ref 6.0–8.3)

## 2014-01-16 LAB — CBC WITH DIFFERENTIAL/PLATELET
Basophils Absolute: 0.1 10*3/uL (ref 0.0–0.1)
Basophils Relative: 1 % (ref 0–1)
EOS PCT: 2 % (ref 0–5)
Eosinophils Absolute: 0.1 10*3/uL (ref 0.0–0.7)
HEMATOCRIT: 42.9 % (ref 39.0–52.0)
HEMOGLOBIN: 14.8 g/dL (ref 13.0–17.0)
LYMPHS ABS: 2.7 10*3/uL (ref 0.7–4.0)
LYMPHS PCT: 42 % (ref 12–46)
MCH: 30.6 pg (ref 26.0–34.0)
MCHC: 34.5 g/dL (ref 30.0–36.0)
MCV: 88.6 fL (ref 78.0–100.0)
MONO ABS: 0.4 10*3/uL (ref 0.1–1.0)
Monocytes Relative: 6 % (ref 3–12)
Neutro Abs: 3.1 10*3/uL (ref 1.7–7.7)
Neutrophils Relative %: 49 % (ref 43–77)
Platelets: 191 10*3/uL (ref 150–400)
RBC: 4.84 MIL/uL (ref 4.22–5.81)
RDW: 14 % (ref 11.5–15.5)
WBC: 6.4 10*3/uL (ref 4.0–10.5)

## 2014-01-16 LAB — TSH: TSH: 1.753 u[IU]/mL (ref 0.350–4.500)

## 2014-01-18 LAB — HEMOGLOBIN A1C
Hgb A1c MFr Bld: 5.8 % — ABNORMAL HIGH (ref <5.7)
MEAN PLASMA GLUCOSE: 120 mg/dL — AB (ref <117)

## 2014-01-22 ENCOUNTER — Encounter: Payer: Self-pay | Admitting: Family Medicine

## 2014-01-30 ENCOUNTER — Encounter (HOSPITAL_COMMUNITY): Payer: Self-pay | Admitting: Emergency Medicine

## 2014-01-30 ENCOUNTER — Emergency Department (HOSPITAL_COMMUNITY): Payer: Medicare HMO

## 2014-01-30 ENCOUNTER — Observation Stay (HOSPITAL_COMMUNITY)
Admission: EM | Admit: 2014-01-30 | Discharge: 2014-01-31 | Disposition: A | Discharge status: Home / Self Care | Payer: Medicare HMO | Attending: Internal Medicine | Admitting: Internal Medicine

## 2014-01-30 DIAGNOSIS — R0609 Other forms of dyspnea: Secondary | ICD-10-CM | POA: Insufficient documentation

## 2014-01-30 DIAGNOSIS — I359 Nonrheumatic aortic valve disorder, unspecified: Secondary | ICD-10-CM | POA: Insufficient documentation

## 2014-01-30 DIAGNOSIS — R55 Syncope and collapse: Principal | ICD-10-CM | POA: Diagnosis present

## 2014-01-30 DIAGNOSIS — I6529 Occlusion and stenosis of unspecified carotid artery: Secondary | ICD-10-CM | POA: Insufficient documentation

## 2014-01-30 DIAGNOSIS — K219 Gastro-esophageal reflux disease without esophagitis: Secondary | ICD-10-CM | POA: Diagnosis present

## 2014-01-30 DIAGNOSIS — I517 Cardiomegaly: Secondary | ICD-10-CM

## 2014-01-30 DIAGNOSIS — R079 Chest pain, unspecified: Secondary | ICD-10-CM

## 2014-01-30 DIAGNOSIS — Z79899 Other long term (current) drug therapy: Secondary | ICD-10-CM | POA: Insufficient documentation

## 2014-01-30 DIAGNOSIS — R9431 Abnormal electrocardiogram [ECG] [EKG]: Secondary | ICD-10-CM | POA: Insufficient documentation

## 2014-01-30 DIAGNOSIS — R0989 Other specified symptoms and signs involving the circulatory and respiratory systems: Secondary | ICD-10-CM | POA: Insufficient documentation

## 2014-01-30 DIAGNOSIS — Z7982 Long term (current) use of aspirin: Secondary | ICD-10-CM | POA: Insufficient documentation

## 2014-01-30 HISTORY — DX: Gastro-esophageal reflux disease without esophagitis: K21.9

## 2014-01-30 LAB — CBG MONITORING, ED: Glucose-Capillary: 123 mg/dL — ABNORMAL HIGH (ref 70–99)

## 2014-01-30 LAB — CBC WITH DIFFERENTIAL/PLATELET
Basophils Absolute: 0 10*3/uL (ref 0.0–0.1)
Basophils Relative: 0 % (ref 0–1)
Eosinophils Absolute: 0.1 10*3/uL (ref 0.0–0.7)
Eosinophils Relative: 2 % (ref 0–5)
HEMATOCRIT: 43.9 % (ref 39.0–52.0)
HEMOGLOBIN: 15.3 g/dL (ref 13.0–17.0)
LYMPHS ABS: 2.2 10*3/uL (ref 0.7–4.0)
Lymphocytes Relative: 31 % (ref 12–46)
MCH: 30.8 pg (ref 26.0–34.0)
MCHC: 34.9 g/dL (ref 30.0–36.0)
MCV: 88.5 fL (ref 78.0–100.0)
MONO ABS: 0.7 10*3/uL (ref 0.1–1.0)
Monocytes Relative: 10 % (ref 3–12)
NEUTROS PCT: 57 % (ref 43–77)
Neutro Abs: 4.2 10*3/uL (ref 1.7–7.7)
Platelets: 167 10*3/uL (ref 150–400)
RBC: 4.96 MIL/uL (ref 4.22–5.81)
RDW: 13.1 % (ref 11.5–15.5)
WBC: 7.2 10*3/uL (ref 4.0–10.5)

## 2014-01-30 LAB — COMPREHENSIVE METABOLIC PANEL
ALK PHOS: 66 U/L (ref 39–117)
ALT: 15 U/L (ref 0–53)
AST: 19 U/L (ref 0–37)
Albumin: 4 g/dL (ref 3.5–5.2)
BILIRUBIN TOTAL: 0.6 mg/dL (ref 0.3–1.2)
BUN: 15 mg/dL (ref 6–23)
CHLORIDE: 101 meq/L (ref 96–112)
CO2: 20 mEq/L (ref 19–32)
CREATININE: 0.86 mg/dL (ref 0.50–1.35)
Calcium: 9.1 mg/dL (ref 8.4–10.5)
GFR calc non Af Amer: 88 mL/min — ABNORMAL LOW (ref >90)
GLUCOSE: 113 mg/dL — AB (ref 70–99)
POTASSIUM: 3.9 meq/L (ref 3.7–5.3)
Sodium: 138 mEq/L (ref 137–147)
Total Protein: 7.2 g/dL (ref 6.0–8.3)

## 2014-01-30 LAB — TROPONIN I

## 2014-01-30 LAB — D-DIMER, QUANTITATIVE (NOT AT ARMC)

## 2014-01-30 MED ORDER — SODIUM CHLORIDE 0.9 % IV SOLN: 250.0000 mL | INTRAVENOUS | Status: DC | PRN

## 2014-01-30 MED ORDER — RISAQUAD PO CAPS
1.0000 | ORAL_CAPSULE | Freq: Every day | ORAL | Status: DC
  Administered 2014-01-30 – 2014-01-31 (×2): 1 via ORAL
  Filled 2014-01-30 (×2): qty 1

## 2014-01-30 MED ORDER — ALBUTEROL SULFATE (2.5 MG/3ML) 0.083% IN NEBU: 2.5000 mg | INHALATION_SOLUTION | RESPIRATORY_TRACT | Status: DC | PRN

## 2014-01-30 MED ORDER — ASPIRIN 81 MG PO CHEW
324.0000 mg | CHEWABLE_TABLET | Freq: Once | ORAL | Status: AC
  Administered 2014-01-30: 324 mg via ORAL
  Filled 2014-01-30: qty 4

## 2014-01-30 MED ORDER — ONDANSETRON HCL 4 MG/2ML IJ SOLN
4.0000 mg | Freq: Once | INTRAMUSCULAR | Status: AC
  Administered 2014-01-30: 4 mg via INTRAVENOUS
  Filled 2014-01-30: qty 2

## 2014-01-30 MED ORDER — SODIUM CHLORIDE 0.9 % IV SOLN
Freq: Once | INTRAVENOUS | Status: AC
  Administered 2014-01-30: 05:00:00 via INTRAVENOUS

## 2014-01-30 MED ORDER — ONDANSETRON HCL 4 MG/2ML IJ SOLN: 4.0000 mg | Freq: Four times a day (QID) | INTRAMUSCULAR | Status: DC | PRN

## 2014-01-30 MED ORDER — SODIUM CHLORIDE 0.9 % IJ SOLN
3.0000 mL | Freq: Two times a day (BID) | INTRAMUSCULAR | Status: DC
  Administered 2014-01-30 (×2): 3 mL via INTRAVENOUS

## 2014-01-30 MED ORDER — SODIUM CHLORIDE 0.9 % IJ SOLN: 3.0000 mL | INTRAMUSCULAR | Status: DC | PRN

## 2014-01-30 MED ORDER — SODIUM CHLORIDE 0.9 % IJ SOLN: 3.0000 mL | Freq: Two times a day (BID) | INTRAMUSCULAR | Status: DC

## 2014-01-30 MED ORDER — ONDANSETRON HCL 4 MG PO TABS: 4.0000 mg | ORAL_TABLET | Freq: Four times a day (QID) | ORAL | Status: DC | PRN

## 2014-01-30 MED ORDER — ACETAMINOPHEN 650 MG RE SUPP: 650.0000 mg | Freq: Four times a day (QID) | RECTAL | Status: DC | PRN

## 2014-01-30 MED ORDER — PROBIOTIC DAILY PO CAPS: 1.0000 | ORAL_CAPSULE | Freq: Every day | ORAL | Status: DC

## 2014-01-30 MED ORDER — PANTOPRAZOLE SODIUM 20 MG PO TBEC
20.0000 mg | DELAYED_RELEASE_TABLET | Freq: Every day | ORAL | Status: DC
  Administered 2014-01-30 – 2014-01-31 (×2): 20 mg via ORAL
  Filled 2014-01-30 (×2): qty 1

## 2014-01-30 MED ORDER — ACETAMINOPHEN 325 MG PO TABS: 650.0000 mg | ORAL_TABLET | Freq: Four times a day (QID) | ORAL | Status: DC | PRN

## 2014-01-30 MED ORDER — ASPIRIN EC 81 MG PO TBEC
162.0000 mg | DELAYED_RELEASE_TABLET | Freq: Every day | ORAL | Status: DC
  Administered 2014-01-30 – 2014-01-31 (×2): 162 mg via ORAL
  Filled 2014-01-30 (×4): qty 2

## 2014-01-30 NOTE — Consult Note (Signed)
CARDIOLOGY CONSULT NOTE     Patient ID: Howard Davenport MRN: [id number] DOB/AGE: 06/26/1946 68 y.o.  Admit date: 01/30/2014 Referring Physician Howard Sis MD Primary Physician Howard Luo MD Primary Cardiologist N/A Reason for Consultation near syncope.  HPI: [name] is a pleasant 68 yo WM with no prior cardiac history who presents for evaluation of near syncope. He has no history of HTN, DM, HL or tobacco abuse. 6 weeks ago he had an episode of near syncope. He was sitting at a table when he developed profuse sweating, Nausea and feeling that he might pass out. Since then he had had several minor symptoms of lightheadedness. Yesterday he had recurrent symptoms of lightheadedness with sweating. He tried sucking on ice without relief. He lay down on the floor but symptoms did not abate. He complained of feeling SOB. He has symptoms of breathlessness fairly frequently. He denies alcohol or tobacco use. He does take some vitamins but quit taking these recently.   Review of systems complete and found to be negative unless listed above   Past Medical History  Diagnosis Date  . Hiatal hernia   . Inguinal hernia   . GERD (gastroesophageal reflux disease)     Family History  Problem Relation Age of Onset  . Leukemia Mother   . Heart disease Father     History   Social History  . Marital Status: Married    Spouse Name: N/A    Number of Children: N/A  . Years of Education: N/A   Occupational History  . Not on file.   Social History Main Topics  . Smoking status: Never Smoker   . Smokeless tobacco: Never Used  . Alcohol Use: No  . Drug Use: No  . Sexual Activity: Yes   Other Topics Concern  . Not on file   Social History Narrative  . No narrative on file    Past Surgical History  Procedure Laterality Date  . Hernia repair       Prescriptions prior to admission  Medication Sig Dispense Refill  . ALFALFA PO Take 3 tablets by mouth 2 (two) times daily.      Marland Kitchen  aspirin 325 MG tablet Take 162.5 mg by mouth daily.       . Carboxymethylcellulose Sodium (LUBRICANT EYE DROPS OP) Place 1 drop into both eyes 3 (three) times daily.       . clotrimazole (LOTRIMIN) 1 % cream Apply 1 application topically 2 (two) times daily as needed. Athletes feet      . ibuprofen (ADVIL,MOTRIN) 200 MG tablet Take 200 mg by mouth every 6 (six) hours as needed for moderate pain.      Marland Kitchen lansoprazole (PREVACID) 15 MG capsule Take 15 mg by mouth daily at 12 noon.      Marland Kitchen OVER THE COUNTER MEDICATION Take 1 capsule by mouth daily. Beta Sitosterol supplyment      . Probiotic Product (PROBIOTIC DAILY PO) Take 1 tablet by mouth daily.      . vitamin E 400 UNIT capsule Take 400 Units by mouth daily.        Physical Exam: Blood pressure 126/74, pulse 65, temperature 97.7 F (36.5 C), temperature source Oral, resp. rate 16, height 5\' 11"  (1.803 m), weight 208 lb 9.6 oz (94.62 kg), SpO2 100.00%.  He is a pleasant WM in NAD.  PERRLA, EOMI, oropharynx is clear. Neck: no JVD, bruits, adenopathy, or thyromegaly. Lungs: clear CV: RRR, normal S1-2, no gallop or murmur. Abdomen: soft,  NT, no HSM, BS + Ext: no edema. Pulses 2+ Neuro: alert and oriented x 3, CN II-XII intact. No focal findings.  Labs:   Lab Results  Component Value Date   WBC 7.2 01/30/2014   HGB 15.3 01/30/2014   HCT 43.9 01/30/2014   MCV 88.5 01/30/2014   PLT 167 01/30/2014    Recent Labs Lab 01/30/14 0433  NA 138  K 3.9  CL 101  CO2 20  BUN 15  CREATININE 0.86  CALCIUM 9.1  PROT 7.2  BILITOT 0.6  ALKPHOS 66  ALT 15  AST 19  GLUCOSE 113*   Lab Results  Component Value Date   TROPONINI <0.30 01/30/2014     Carotid dopplers are without significant disease.   Radiology:CHEST 2 VIEW  COMPARISON: None.  FINDINGS:  Lungs clear. Heart size normal. No pneumothorax or pleural effusion.  IMPRESSION:  Negative chest.  Electronically Signed  By: Howard Davenport M.D.  On: 01/30/2014 05:01   EKG:NSR, RAD,  nonspecific TWA in the inferolateral leads.   ASSESSMENT AND PLAN:  1. Near syncope. History is most compatible with vasovagal episodes. Need to rule out structural heart disease or arrhythmia.  2. Dyspnea, d-dimer normal. PE unlikely.   Will get Echo. If normal would DC home with plans for outpatient event monitor. Given dyspnea and nonspecific TWA may want to consider stress testing as an outpatient.   SignedCollier [facility] 01/30/2014, 1:36 PM

## 2014-01-30 NOTE — Care Management Note (Addendum)
    Page 1 of 1   01/31/2014     3:33:18 PM   CARE MANAGEMENT NOTE 01/31/2014  Patient:  Howard Davenport, Howard Davenport   Account Number:  [id number]  Date Initiated:  01/30/2014  Documentation initiated by:  Howard Davenport  Subjective/Objective Assessment:   68 Y/O M ADMITTED Howard Davenport SYNCOPE.     Action/Plan:   FROM HOME.HAS PCP, PHARMACY.   Anticipated DC Date:  01/31/2014   Anticipated DC Plan:  Howard Davenport  CM consult      Choice offered to / List presented to:             Status of service:  Completed, signed off Medicare Important Message given?   (If response is "NO", the following Medicare IM given date fields will be blank) Date Medicare IM given:   Date Additional Medicare IM given:    Discharge Disposition:  HOME/SELF CARE  Per UR Regulation:  Reviewed for med. necessity/level of care/duration of stay  If discussed at Long Length of Stay Meetings, dates discussed:    Comments:  01/30/14 Howard Fredricksen RN,BSN NCM [phone] NO ANTICIPATED D/C NEEDS.

## 2014-01-30 NOTE — Progress Notes (Signed)
Addendum  Discussed with Cardiology who recommended a formal consult by them. Will call.  Vernell Leep, MD, FACP, FHM. [facility] Pager [phone]  If 7PM-7AM, please contact night-coverage [email or web address] Password [email or web address] 01/30/2014, 8:51 AM

## 2014-01-30 NOTE — Progress Notes (Signed)
  Echocardiogram 2D Echocardiogram has been performed.  Diamond Nickel 01/30/2014, 2:33 PM

## 2014-01-30 NOTE — H&P (Addendum)
History and Physical  Howard Davenport [id number] DOB: [birth year] DOA: 01/30/2014  Referring physician: EDP PCP: Odette Fraction, MD  Outpatient Specialists:  1. Cardiology: Awaiting to see Dr. Candee Furbish on 02/12/14  Chief Complaint: Lightheadedness and feeling like passing out.  HPI: Howard Davenport is a 68 y.o. male with no significant past medical history except for GERD, status post benign lump removal from right side of neck-has recurred, right inguinal herniorrhaphy, physically active individual, presented to the ED for the second time in 6 weeks for complaints of lightheadedness and feeling like passing out. It first started out when he returned from work and sat on a chair and started feeling lightheaded, nauseous, heart like he was going to pass out. He was evaluated in ED on 12/22/13 and workup was essentially negative. He was reassessed by his PCP on 12/26/13 and the symptoms were attributed to vasovagal near syncope. Patient declined further testing at that time. Since then, patient has continued to have intermittent episodes of similar symptoms with varying degrees of severity. These episodes occur mostly in the upright position. He states that he feels dizzy, lightheaded, hot, nauseous and like is going to pass out. He claims that he has "difficulty catching her breath" and at times feels that "his heart is beating slow". He denies headache, tinnitus, vertigo, asymmetrical limb weakness, slurred speech or facial asymmetry. On one occasion he noticed some tingling of his left upper extremity. Applying ice to his forehead or exposure to cold seems to help symptoms. There is no pattern to these episodes and may last up to 30-45 minutes each time. Last night at approximately 2:30 AM, he woke up to use the toilet and then when he returned to lie down, started experiencing a hot sensation from within and was feeling uncomfortable. He got up and applied some ice to his forehead then started  feeling lightheaded and sat on the chair and did not want to get up for fear of passing out. He felt very uncomfortable and called his spouse and proceeded to the ED. He denies anxiety, depression or excess stressors in his life. In the ED, workup including orthostatic blood pressure checks, lab work including troponin x1, d-dimer and chest x-ray have been negative. EKG shows some change is as outlined below. Hospitalist admission requested.  Review of Systems: All systems reviewed and apart from history of presenting illness, are negative.  Past Medical History  Diagnosis Date  . Hiatal hernia   . Inguinal hernia   . GERD (gastroesophageal reflux disease)    Past Surgical History  Procedure Laterality Date  . Hernia repair     Social History:  reports that he has never smoked. He does not have any smokeless tobacco history on file. He reports that he does not drink alcohol or use illicit drugs.  married. Physically active- used to do infrequent walking until a week ago and has taken up cycling.  No Known Allergies  Family History  Problem Relation Age of Onset  . Leukemia Mother   . Heart disease Father     Prior to Admission medications   Medication Sig Start Date End Date Taking? Authorizing Provider  ALFALFA PO Take 3 tablets by mouth 2 (two) times daily.   Yes Historical Provider, MD  aspirin 325 MG tablet Take 162.5 mg by mouth daily.    Yes Historical Provider, MD  Carboxymethylcellulose Sodium (LUBRICANT EYE DROPS OP) Place 1 drop into both eyes 3 (three) times daily.  12/04/13  Yes Historical Provider, MD  clotrimazole (LOTRIMIN) 1 % cream Apply 1 application topically 2 (two) times daily as needed. Athletes feet   Yes Historical Provider, MD  ibuprofen (ADVIL,MOTRIN) 200 MG tablet Take 200 mg by mouth every 6 (six) hours as needed for moderate pain.   Yes Historical Provider, MD  lansoprazole (PREVACID) 15 MG capsule Take 15 mg by mouth daily at 12 noon.   Yes Historical  Provider, MD  OVER THE COUNTER MEDICATION Take 1 capsule by mouth daily. Beta Sitosterol supplyment   Yes Historical Provider, MD  Probiotic Product (PROBIOTIC DAILY PO) Take 1 tablet by mouth daily.   Yes Historical Provider, MD  vitamin E 400 UNIT capsule Take 400 Units by mouth daily.   Yes Historical Provider, MD   Physical Exam: Filed Vitals:   01/30/14 0410 01/30/14 0411 01/30/14 0412 01/30/14 0634  BP: 131/80 118/76 131/80 116/71  Pulse: 60 59 76 57  Temp:    97.8 F (36.6 C)  TempSrc:    Oral  Resp:    18  Height:      Weight:      SpO2:    97%     General exam: Moderately built and nourished  pleasant male patient, lying comfortably supine on the gurney in no obvious distress.  Head, eyes and ENT: Nontraumatic and normocephalic. Pupils equally reacting to light and accommodation. Oral mucosa moist.  Neck: Supple. No JVD, carotid bruit or thyromegaly. approximately 3 x 5 cm oblong, subcutaneous soft tissue swelling on the right posterior aspect of neck just behind a surgical scar.  Lymphatics: No lymphadenopathy.  Respiratory system: Clear to auscultation. No increased work of breathing.  Cardiovascular system: S1 and S2 heard, RRR. No JVD, murmurs, gallops, clicks or pedal edema.  Gastrointestinal system: Abdomen is nondistended, soft and nontender. Normal bowel sounds heard. No organomegaly or masses appreciated. Uncomplicated ventral hernia. No laparotomy scars.  Central nervous system: Alert and oriented. No focal neurological deficits.  Extremities: Symmetric 5 x 5 power. Peripheral pulses symmetrically felt.   Skin: No rashes or acute findings.  Musculoskeletal system: Negative exam.  Psychiatry: Pleasant and cooperative.   Labs on Admission:  Basic Metabolic Panel:  Recent Labs Lab 01/30/14 0433  NA 138  K 3.9  CL 101  CO2 20  GLUCOSE 113*  BUN 15  CREATININE 0.86  CALCIUM 9.1   Liver Function Tests:  Recent Labs Lab 01/30/14 0433  AST  19  ALT 15  ALKPHOS 66  BILITOT 0.6  PROT 7.2  ALBUMIN 4.0   No results found for this basename: LIPASE, AMYLASE,  in the last 168 hours No results found for this basename: AMMONIA,  in the last 168 hours CBC:  Recent Labs Lab 01/30/14 0433  WBC 7.2  NEUTROABS 4.2  HGB 15.3  HCT 43.9  MCV 88.5  PLT 167   Cardiac Enzymes:  Recent Labs Lab 01/30/14 0433  TROPONINI <0.30    BNP (last 3 results) No results found for this basename: PROBNP,  in the last 8760 hours CBG:  Recent Labs Lab 01/30/14 0404  GLUCAP 123*    Radiological Exams on Admission: Dg Chest 2 View  01/30/2014   CLINICAL DATA:  Shortness of breath and syncope.  EXAM: CHEST  2 VIEW  COMPARISON:  None.  FINDINGS: Lungs clear. Heart size normal. No pneumothorax or pleural effusion.  IMPRESSION: Negative chest.   Electronically Signed   By: Inge Rise M.D.   On: 01/30/2014 05:01  EKG: Independently reviewed.  Sinus rhythm at 63 beats per minute with first degree AV block, normal axis, T wave inversion in inferior and lateral leads. No acute changes.  Assessment/Plan Principal Problem:   Near syncope Active Problems:   GERD (gastroesophageal reflux disease)   1. Near syncope, recurrent/lightheadedness: DD-orthostatic hypotension, arrhythmias, vasovagal, panic attacks versus others. Admit to telemetry for observation. Check orthostatic blood pressures every shift. We'll obtain 2-D echo and carotid Dopplers. No clear symptoms or signs suggestive for CVA or inner ear issues. Will discuss with cardiology. 2. GERD: Continue PPI     Code Status:  Full  Family Communication:  Discussed extensively with patient and spouse at bedside.  Disposition Plan:  Home when medically stable.   Time spent:  31 minutes  Tyerra Loretto, MD, FACP, FHM. [facility] Pager [phone]  If 7PM-7AM, please contact night-coverage [email or web address] Password [facility] 01/30/2014, 8:46 AM

## 2014-01-30 NOTE — ED Provider Notes (Signed)
CSN: [id number]     Arrival date & time 01/30/14  0355 History   First MD Initiated Contact with Patient 01/30/14 [phone]     Chief Complaint  Patient presents with  . Near Syncope     (Consider location/radiation/quality/duration/timing/severity/associated sxs/prior Treatment) Patient is a 68 y.o. male presenting with near-syncope. The history is provided by the patient.  Near Syncope  He woke up at 2:30 AM to go to the bathroom to urinate and as he returned, he noted that he was lightheaded and had that nausea and felt hot. He has been having similar symptoms over the past 6-7 weeks. They're not necessarily related to exertion. For the last several days, he has not felt like he is been back to normal. There's been ongoing lightheadedness and intermittent nausea. He has had diaphoresis with the other episodes. He did come to the ED for evaluation and saw his PCP who recommended referral to a cardiologist. He denies chest pain, heaviness, tightness, pressure. He denies dyspnea. He has noted no decrease in his exercise tolerance and he has not had any symptoms that wake him up at night. Dizziness is worse when standing. He has been diagnosed with possible vasovagal near syncope.  Past Medical History  Diagnosis Date  . Hiatal hernia   . Inguinal hernia   . GERD (gastroesophageal reflux disease)    Past Surgical History  Procedure Laterality Date  . Hernia repair     History reviewed. No pertinent family history. History  Substance Use Topics  . Smoking status: Never Smoker   . Smokeless tobacco: Not on file  . Alcohol Use: No    Review of Systems  Cardiovascular: Positive for near-syncope.  All other systems reviewed and are negative.      Allergies  Review of patient's allergies indicates no known allergies.  Home Medications   Current Outpatient Rx  Name  Route  Sig  Dispense  Refill  . Carboxymethylcellulose Sodium (LUBRICANT EYE DROPS OP)               .  lansoprazole (PREVACID) 15 MG capsule   Oral   Take 15 mg by mouth daily at 12 noon.         . Probiotic Product (PROBIOTIC DAILY PO)   Oral   Take 1 tablet by mouth daily.         . vitamin E 400 UNIT capsule   Oral   Take 400 Units by mouth daily.          BP 131/80  Pulse 76  Temp(Src) 97.8 F (36.6 C) (Oral)  Resp 10  Ht 5\' 11"  (1.803 m)  Wt 210 lb (95.255 kg)  BMI 29.30 kg/m2  SpO2 96% Physical Exam  Nursing note and vitals reviewed.  68 year old male, resting comfortably and in no acute distress. Vital signs are normal. Oxygen saturation is 96%, which is normal. Head is normocephalic and atraumatic. PERRLA, EOMI. Oropharynx is clear. Neck is nontender and supple without adenopathy or JVD. There are no carotid bruits. Lipoma is present in the right posterior neck. This is freely mobile and nontender. Back is nontender and there is no CVA tenderness. Lungs are clear without rales, wheezes, or rhonchi. Chest is nontender. Heart has regular rate and rhythm without murmur. Abdomen is soft, flat, nontender without masses or hepatosplenomegaly and peristalsis is normoactive. Extremities have no cyanosis or edema, full range of motion is present. Skin is warm and dry without rash. Neurologic: Mental status is  normal, cranial nerves are intact, there are no motor or sensory deficits.  ED Course  Procedures (including critical care time) Labs Review Results for orders placed during the hospital encounter of 01/30/14  CBC WITH DIFFERENTIAL      Result Value Ref Range   WBC 7.2  4.0 - 10.5 K/uL   RBC 4.96  4.22 - 5.81 MIL/uL   Hemoglobin 15.3  13.0 - 17.0 g/dL   HCT 43.9  39.0 - 52.0 %   MCV 88.5  78.0 - 100.0 fL   MCH 30.8  26.0 - 34.0 pg   MCHC 34.9  30.0 - 36.0 g/dL   RDW 13.1  11.5 - 15.5 %   Platelets 167  150 - 400 K/uL   Neutrophils Relative % 57  43 - 77 %   Neutro Abs 4.2  1.7 - 7.7 K/uL   Lymphocytes Relative 31  12 - 46 %   Lymphs Abs 2.2  0.7 - 4.0  K/uL   Monocytes Relative 10  3 - 12 %   Monocytes Absolute 0.7  0.1 - 1.0 K/uL   Eosinophils Relative 2  0 - 5 %   Eosinophils Absolute 0.1  0.0 - 0.7 K/uL   Basophils Relative 0  0 - 1 %   Basophils Absolute 0.0  0.0 - 0.1 K/uL  COMPREHENSIVE METABOLIC PANEL      Result Value Ref Range   Sodium 138  137 - 147 mEq/L   Potassium 3.9  3.7 - 5.3 mEq/L   Chloride 101  96 - 112 mEq/L   CO2 20  19 - 32 mEq/L   Glucose, Bld 113 (*) 70 - 99 mg/dL   BUN 15  6 - 23 mg/dL   Creatinine, Ser 0.86  0.50 - 1.35 mg/dL   Calcium 9.1  8.4 - 10.5 mg/dL   Total Protein 7.2  6.0 - 8.3 g/dL   Albumin 4.0  3.5 - 5.2 g/dL   AST 19  0 - 37 U/L   ALT 15  0 - 53 U/L   Alkaline Phosphatase 66  39 - 117 U/L   Total Bilirubin 0.6  0.3 - 1.2 mg/dL   GFR calc non Af Amer 88 (*) >90 mL/min   GFR calc Af Amer >90  >90 mL/min  TROPONIN I      Result Value Ref Range   Troponin I <0.30  <0.30 ng/mL  D-DIMER, QUANTITATIVE      Result Value Ref Range   D-Dimer, Quant <0.27  0.00 - 0.48 ug/mL-FEU  CBG MONITORING, ED      Result Value Ref Range   Glucose-Capillary 123 (*) 70 - 99 mg/dL   Imaging Review Dg Chest 2 View  01/30/2014   CLINICAL DATA:  Shortness of breath and syncope.  EXAM: CHEST  2 VIEW  COMPARISON:  None.  FINDINGS: Lungs clear. Heart size normal. No pneumothorax or pleural effusion.  IMPRESSION: Negative chest.   Electronically Signed   By: Inge Rise M.D.   On: 01/30/2014 05:01     EKG Interpretation   Date/Time:  Wednesday January 30 2014 04:01:23 EDT Ventricular Rate:  63 PR Interval:  207 QRS Duration: 100 QT Interval:  390 QTC Calculation: 399 R Axis:   106 Text Interpretation:  Age not entered, assumed to be  68 years old for  purpose of ECG interpretation Sinus rhythm Borderline prolonged PR  interval Right axis deviation Borderline repolarization abnormality  Nonspecific T wave abnormality When compared with ECG  of 12/22/2013,  Nonspecific T wave abnormality is now Present  Confirmed by [facility]  MD,  Lupita Rosales ([id number]) on 01/30/2014 4:23:51 AM      MDM   Final diagnoses:  Near syncope    Dizziness and nausea of uncertain cause. ECG shows slight T-wave changes in the anterolateral leads which were not present on her ECGs which does make me concerned for possible cardiac etiology. Old records are reviewed and he was seen in the ED on November 7 with negative workup and then followed up in his primary care physician's office at which time laboratory work was significant only for mildly elevated glucose and hemoglobin A1c.  ED workup is unremarkable except for above-noted ECG changes. Orthostatic vital signs do not show significant change. Case is discussed with Dr. Algis Liming of [facility] who agrees to admit the patient under observation status.  Delora Fuel, MD [id number] [phone]

## 2014-01-30 NOTE — ED Notes (Signed)
Bed: WA03 Expected date:  Expected time:  Means of arrival:  Comments: EMS 67yo Near syncope

## 2014-01-30 NOTE — ED Notes (Signed)
Per EMS report: pt from home: pt has been having issues of SOB, nausea, lightheadedness,diaphoresis, dizziness, and near syncope daily for about 3 weeks.  This evening's episode was worse than his normal episode. Pt has been seen recently for these symptoms and had a cardio and neuro workup. Pt has also follow-up with his MD without a explanation for his symptoms. EMS noted that pt appeared anxious on their arrival. Pt a/o x 4.  CBG: 104.  Pt observed ambulating from stretcher to bed without difficulty.

## 2014-01-30 NOTE — Progress Notes (Signed)
VASCULAR LAB PRELIMINARY  PRELIMINARY  PRELIMINARY  PRELIMINARY  Carotid duplex completed.    Preliminary report:  No evidence of ICA stenosis bilaterally.Vertebral artery flow is antegrade.  Marqual Mi, RVS 01/30/2014, 10:03 AM

## 2014-01-31 NOTE — Discharge Summary (Signed)
Physician Discharge Summary  Howard Davenport [id number] DOB: [birth year] DOA: 01/30/2014  PCP: Odette Fraction, MD  Admit date: 01/30/2014 Discharge date: 01/31/2014  Time spent: Less than 30 minutes  Recommendations for Outpatient Follow-up:  1. Dr. Jenna Luo, PCP in 1 week. 2. [facility]: Cardiologist's office will arrange for outpatient heart monitor, possible stress test and followup.  Discharge Diagnoses:  Principal Problem:   Near syncope Active Problems:   GERD (gastroesophageal reflux disease)   Discharge Condition: Improved & Stable  Diet recommendation: Heart healthy diet  Filed Weights   01/30/14 0402 01/30/14 0850  Weight: 95.255 kg (210 lb) 94.62 kg (208 lb 9.6 oz)    History of present illness & hospital course:  68 year old male patient with no significant past medical history except for GERD, status post benign lump removal from right side of neck that has been recurrent, right inguinal herniorrhaphy, physically quite active, presented to the ED for the second time in 6 weeks with complaints of lightheadedness and sensations of nearly passing out. Extensive workup including labs and EKG during the first visit to the ED were negative. He was seen by his PCP and referred to see a cardiologist on 02/12/14 but had recurrence of an episode the night prior to admission which brought him in. He has symptoms of lightheadedness/presyncope and warmth periodically over the last 6 weeks, do not seem to be clearly positional and has never passed out. He has no associated chest pain. He gives history of on and off sensation of "difficulty catching a breath" and "heart beating slow". On the night prior to admission, he experienced lightheadedness and sensation of nearly passing out after using the toilet and returning to bed. Symptoms seem to be severe enough where he called his PCP and was advised to come to the ED. He was admitted  to telemetry where he remained in sinus rhythm except for an episode of 7 beat NSSVT which does not explain his symptoms. He had a single episode of minimally orthostatic blood pressure changes but several other blood pressure recordings have been unremarkable for orthostatic changes. Lab work including CBC, CMP, troponin, d-dimer were negative. Chest x-ray does not show acute findings. Carotid Dopplers shows 1-39% left ICA stenosis and 2-D echo is unremarkable. Cardiology was consulted and have seen him today and suggest that his episodes are possibly vasovagal without a definite trigger. They will arrange for a 30 day outpatient heart monitor to assess for arrhythmias and given some exertional dyspnea, they may consider stress Cardiolite. Discussed with Dr. Aundra Dubin who has cleared him for discharge home.   Consultations:  Cardiology  Procedures:  2-D echo 01/30/14: Study Conclusions  - Left ventricle: The cavity size was normal. There was mild concentric hypertrophy. Systolic function was vigorous. The estimated ejection fraction was in the range of 65% to 70%. Calcified apical false tendon. Wall motion was normal; there were no regional wall motion abnormalities. Doppler parameters are consistent with abnormal left ventricular relaxation (grade 1 diastolic dysfunction). The E/e' ratio is <10, suggesting normal LV filling pressure. - Aortic valve: Sclerosis without stenosis. Transvalvular velocity was within the normal range. There was no stenosis. Trivial regurgitation. - Left atrium: The atrium was normal in size. - Inferior vena cava: The vessel was normal in size; the respirophasic diameter changes were in the normal range (= 50%); findings are consistent with normal central venous pressure. - Pericardium, extracardiac: There was no pericardial effusion.   Carotid Dopplers 01/30/14:  Summary:  - Mild technical difficulty due to high bifurcations . - Right - No obvious evidence  of ICA stenosis. Vertebral artery flow is antegrade. - Left - 1% TO 39% Lowest range ICA stenosis. Vertebral artery flow is antegrade.    Discharge Exam:  Complaints:  Patient continues to complain of intermittent mild lightheadedness and "unable to take a deep breath" but denies headache, chest pain or palpitations. No sensations of feeling like passing out.  Filed Vitals:   01/31/14 0602 01/31/14 0952 01/31/14 0953 01/31/14 0954  BP: 100/69 119/75 124/85 112/75  Pulse: 78 58 68 77  Temp:      TempSrc:      Resp:      Height:      Weight:      SpO2:       temperature 98.1F, respiration 18 per minute, oxygen saturations 97%.  General exam: Pleasant middle-aged male sitting comfortably on chair in no obvious distress. Respiratory system: Clear. No increased work of breathing. Cardiovascular system: S1 & S2 heard, RRR. No JVD, murmurs, gallops, clicks or pedal edema. Telemetry: Sinus bradycardia in the high 50s to sinus rhythm. 7 beat NSSVT Gastrointestinal system: Abdomen is nondistended, soft and nontender. Normal bowel sounds heard. Central nervous system: Alert and oriented. No focal neurological deficits. Extremities: Symmetric 5 x 5 power.  Discharge Instructions      Discharge Orders   Future Appointments Provider Department Dept Phone   02/12/2014 3:30 PM Candee Furbish, MD [place] [phone]   Future Orders Complete By Expires   Call MD for:  persistant dizziness or light-headedness  As directed    Diet - low sodium heart healthy  As directed    Increase activity slowly  As directed        Medication List         ALFALFA PO  Take 3 tablets by mouth 2 (two) times daily.     aspirin 325 MG tablet  Take 162.5 mg by mouth daily.     clotrimazole 1 % cream  Commonly known as:  LOTRIMIN  Apply 1 application topically 2 (two) times daily as needed. Athletes feet     ibuprofen 200 MG tablet  Commonly known as:  ADVIL,MOTRIN  Take 200 mg  by mouth every 6 (six) hours as needed for moderate pain.     lansoprazole 15 MG capsule  Commonly known as:  PREVACID  Take 15 mg by mouth daily at 12 noon.     LUBRICANT EYE DROPS OP  Place 1 drop into both eyes 3 (three) times daily.     OVER THE COUNTER MEDICATION  Take 1 capsule by mouth daily. Beta Sitosterol supplyment     PROBIOTIC DAILY PO  Take 1 tablet by mouth daily.     vitamin E 400 UNIT capsule  Take 400 Units by mouth daily.          The results of significant diagnostics from this hospitalization (including imaging, microbiology, ancillary and laboratory) are listed below for reference.    Significant Diagnostic Studies: Dg Chest 2 View  01/30/2014   CLINICAL DATA:  Shortness of breath and syncope.  EXAM: CHEST  2 VIEW  COMPARISON:  None.  FINDINGS: Lungs clear. Heart size normal. No pneumothorax or pleural effusion.  IMPRESSION: Negative chest.   Electronically Signed   By: Inge Rise M.D.   On: 01/30/2014 05:01    Microbiology: No results found for this or any previous visit (from the past  240 hour(s)).   Labs: Basic Metabolic Panel:  Recent Labs Lab 01/30/14 0433  NA 138  K 3.9  CL 101  CO2 20  GLUCOSE 113*  BUN 15  CREATININE 0.86  CALCIUM 9.1   Liver Function Tests:  Recent Labs Lab 01/30/14 0433  AST 19  ALT 15  ALKPHOS 66  BILITOT 0.6  PROT 7.2  ALBUMIN 4.0   No results found for this basename: LIPASE, AMYLASE,  in the last 168 hours No results found for this basename: AMMONIA,  in the last 168 hours CBC:  Recent Labs Lab 01/30/14 0433  WBC 7.2  NEUTROABS 4.2  HGB 15.3  HCT 43.9  MCV 88.5  PLT 167   Cardiac Enzymes:  Recent Labs Lab 01/30/14 0433  TROPONINI <0.30   BNP: BNP (last 3 results) No results found for this basename: PROBNP,  in the last 8760 hours CBG:  Recent Labs Lab 01/30/14 0404  GLUCAP 123*    Additional labs: 1. D-dimer: <0.27  At patient's request, discussed his care with his  male friend at bedside.  Signed:  Vernell Leep, MD, FACP, FHM. [facility] Pager [phone]  If 7PM-7AM, please contact night-coverage [email or web address] Password [facility] 01/31/2014, 11:54 AM

## 2014-01-31 NOTE — Progress Notes (Signed)
Patient ID: Howard Davenport, male   DOB: 1946-10-24, 68 y.o.   MRN: [id number]   SUBJECTIVE: No lightheadedness overnight.  Telemetry reviewed, showed a 7 beat run of SVT but nothing to explain presyncope.    Scheduled Meds: . acidophilus  1 capsule Oral Daily  . aspirin EC  162 mg Oral Daily  . pantoprazole  20 mg Oral Daily  . sodium chloride  3 mL Intravenous Q12H  . sodium chloride  3 mL Intravenous Q12H   Continuous Infusions:  PRN Meds:.sodium chloride, acetaminophen, acetaminophen, albuterol, ondansetron (ZOFRAN) IV, ondansetron, sodium chloride   Filed Vitals:   01/30/14 2300 01/31/14 0600 01/31/14 0601 01/31/14 0602  BP: 123/70 119/67 98/67 100/69  Pulse: 68 46 62 78  Temp: 97.9 F (36.6 C) 98.3 F (36.8 C)    TempSrc: Oral Oral    Resp: 18 18    Height:      Weight:      SpO2: 100% 97%      Intake/Output Summary (Last 24 hours) at 01/31/14 0739 Last data filed at 01/31/14 0600  Gross per 24 hour  Intake    720 ml  Output   1000 ml  Net   -280 ml    LABS: Basic Metabolic Panel:  Recent Labs  01/30/14 0433  NA 138  K 3.9  CL 101  CO2 20  GLUCOSE 113*  BUN 15  CREATININE 0.86  CALCIUM 9.1   Liver Function Tests:  Recent Labs  01/30/14 0433  AST 19  ALT 15  ALKPHOS 66  BILITOT 0.6  PROT 7.2  ALBUMIN 4.0   No results found for this basename: LIPASE, AMYLASE,  in the last 72 hours CBC:  Recent Labs  01/30/14 0433  WBC 7.2  NEUTROABS 4.2  HGB 15.3  HCT 43.9  MCV 88.5  PLT 167   Cardiac Enzymes:  Recent Labs  01/30/14 0433  TROPONINI <0.30   BNP: No components found with this basename: POCBNP,  D-Dimer:  Recent Labs  01/30/14 0433  DDIMER <0.27   Hemoglobin A1C: No results found for this basename: HGBA1C,  in the last 72 hours Fasting Lipid Panel: No results found for this basename: CHOL, HDL, LDLCALC, TRIG, CHOLHDL, LDLDIRECT,  in the last 72 hours Thyroid Function Tests: No results found for this basename: TSH,  T4TOTAL, FREET3, T3FREE, THYROIDAB,  in the last 72 hours Anemia Panel: No results found for this basename: VITAMINB12, FOLATE, FERRITIN, TIBC, IRON, RETICCTPCT,  in the last 72 hours  RADIOLOGY: Dg Chest 2 View  01/30/2014   CLINICAL DATA:  Shortness of breath and syncope.  EXAM: CHEST  2 VIEW  COMPARISON:  None.  FINDINGS: Lungs clear. Heart size normal. No pneumothorax or pleural effusion.  IMPRESSION: Negative chest.   Electronically Signed   By: Inge Rise M.D.   On: 01/30/2014 05:01    PHYSICAL EXAM General: NAD Neck: No JVD, no thyromegaly or thyroid nodule.  Lungs: Clear to auscultation bilaterally with normal respiratory effort. CV: Nondisplaced PMI.  Heart regular S1/S2, no S3/S4, no murmur.  No peripheral edema.  No carotid bruit.  Normal pedal pulses.  Abdomen: Soft, nontender, no hepatosplenomegaly, no distention.  Neurologic: Alert and oriented x 3.  Psych: Normal affect. Extremities: No clubbing or cyanosis.   TELEMETRY: Reviewed telemetry pt in NSR with 7 beat runs SVT  ASSESSMENT AND PLAN: 68 yo without significant past history presented with symptoms of lightheadednes/presyncope and warmth periodically over the last month.  Episodes do  not seem to be positional.  Echo was unremarkable.  Telemetry showed a 7 beat run SVT but this does not explain his symptoms.  Possible vasovagal episodes but no definite trigger.  From a cardiac perspective, I will arrange a 30 day monitor to further assess for arrhythmia and followup.  Given some exertional dyspnea, a stress Cardiolite may be reasonable as well.   Loralie Champagne 01/31/2014 7:42 AM

## 2014-02-01 ENCOUNTER — Telehealth: Payer: Self-pay | Admitting: Family Medicine

## 2014-02-01 NOTE — Telephone Encounter (Signed)
Message copied by Alyson Locket on Fri Feb 01, 2014  3:45 PM ------      Message from: Lenore Manner      Created: Fri Feb 01, 2014 12:36 PM      Regarding: Breathing,      Contact: [phone]       Pt is wanting to speak to Dr Dennard Schaumann about being shortness of breath and fainting, he got set up with heart specialist and he had to go to ER, they ran a bunch of test during his stay at the hospital. He is experience shortness of breath during the day. And he is thinking about going to get him a oxygen tank ------

## 2014-02-01 NOTE — Telephone Encounter (Signed)
I believe the patient is having vasovagal presyncope with panic attack.  Recommended xanax 0.5 mg poq 8 hrs prn.  I explained to patient and called out 30 tabs with 0 refills.  Recheck next week.

## 2014-02-12 ENCOUNTER — Ambulatory Visit (INDEPENDENT_AMBULATORY_CARE_PROVIDER_SITE_OTHER): Payer: Commercial Managed Care - HMO | Admitting: Cardiology

## 2014-02-12 ENCOUNTER — Encounter: Payer: Self-pay | Admitting: Cardiology

## 2014-02-12 VITALS — BP 113/83 | HR 83 | Ht 71.0 in | Wt 213.0 lb

## 2014-02-12 DIAGNOSIS — R55 Syncope and collapse: Secondary | ICD-10-CM

## 2014-02-12 DIAGNOSIS — R0609 Other forms of dyspnea: Secondary | ICD-10-CM

## 2014-02-12 DIAGNOSIS — R002 Palpitations: Secondary | ICD-10-CM

## 2014-02-12 DIAGNOSIS — R0989 Other specified symptoms and signs involving the circulatory and respiratory systems: Secondary | ICD-10-CM

## 2014-02-12 DIAGNOSIS — R0600 Dyspnea, unspecified: Secondary | ICD-10-CM

## 2014-02-12 NOTE — Progress Notes (Signed)
[facility]. [address] [place], [place]  [address] Phone: [phone] Fax:  [phone]  Date:  02/12/2014   ID:  Howard Davenport, DOB 1946/07/05, MRN [id number]  PCP:  Odette Fraction, MD   History of Present Illness: Howard Davenport is a 68 y.o. male here for the evaluation of near-syncope. As it turns out, he was in the hospital on 01/30/14 and was seen by Dr. Martinique in consultation and Dr. Aundra Dubin as rounding MD.   He has no prior cardiac history. No history of hypertension, diabetes, tobacco use, hyperlipidemia. In late January had an episode of near-syncope when sitting at a table developed profuse sweating, nausea and a feeling that he may pass out. He tried sucking on ice without relief. Laying down on the floor did not abate symptoms. He also has symptoms of dyspnea/breathlessness quite frequently.  When he was seen, he had no lightheadedness overnight, telemetry showed a 7 beat run of SVT but nothing to explain presyncope.  His exam was unremarkable. Chest x-ray was normal. Lab work unremarkable. Troponin negative. T-wave abnormality seen on EKG. Nonspecific ST-T wave changes, T wave inversion in the inferior lateral leads.  Over the past month has been having symptoms of lightheadedness/presyncope with warm periodically. Episodes did not seem to be positional, echocardiogram unremarkable. Brief SVT does not explain symptoms. Possible vasovagal but no definite trigger. A 30 day monitor was planned by Dr. Aundra Dubin. Thoughts of a stress Cardiolite given his exertional dyspnea may have been reasonable.  He called back his primary physician Dr. Dennard Schaumann who prescribed him Xanax. Thoughts  that he may be having panic attack, anxiety.  Wt Readings from Last 3 Encounters:  02/12/14 213 lb (96.616 kg)  01/30/14 208 lb 9.6 oz (94.62 kg)  01/15/14 217 lb (98.431 kg)     Past Medical History  Diagnosis Date  . Hiatal hernia   . Inguinal hernia   . GERD (gastroesophageal reflux  disease)     Past Surgical History  Procedure Laterality Date  . Hernia repair      Current Outpatient Prescriptions  Medication Sig Dispense Refill  . ALFALFA PO Take 2 tablets by mouth 2 (two) times daily.       Marland Kitchen ALPRAZolam (XANAX) 0.5 MG tablet Take 0.25 mg by mouth daily.      . Carboxymethylcellulose Sodium (LUBRICANT EYE DROPS OP) Place 1 drop into both eyes 3 (three) times daily.       Marland Kitchen ibuprofen (ADVIL,MOTRIN) 200 MG tablet Take 200 mg by mouth every 6 (six) hours as needed for moderate pain.      Marland Kitchen lansoprazole (PREVACID) 15 MG capsule Take 15 mg by mouth daily at 12 noon.      Marland Kitchen OVER THE COUNTER MEDICATION Take 1 capsule by mouth daily. Beta Sitosterol supplyment      . Probiotic Product (PROBIOTIC DAILY PO) Take 1 tablet by mouth daily.      . vitamin E 400 UNIT capsule Take 400 Units by mouth daily.      . clotrimazole (LOTRIMIN) 1 % cream Apply 1 application topically 2 (two) times daily as needed. Athletes feet       No current facility-administered medications for this visit.    Allergies:   No Known Allergies  Social History:  The patient  reports that he has never smoked. He has never used smokeless tobacco. He reports that he does not drink alcohol or use illicit drugs.   Family History  Problem Relation Age of Onset  . Leukemia Mother   . Heart disease Father     ROS:  Please see the history of present illness.   Positive for syncope, near syncope, dyspnea   All other systems reviewed and negative.   PHYSICAL EXAM: VS:  BP 113/83  Pulse 83  Ht 5\' 11"  (1.803 m)  Wt 213 lb (96.616 kg)  BMI 29.72 kg/m2 Well nourished, well developed, in no acute distress HEENT: normal, [place]/AT, EOMI Neck: no JVD, normal carotid upstroke, no bruit Cardiac:  normal S1, S2; RRR; no murmur Lungs:  clear to auscultation bilaterally, no wheezing, rhonchi or rales Abd: soft, nontender, no hepatomegaly, no bruits Ext: no edema, 2+ distal pulses Skin: warm and dry GU:  deferred Neuro: no focal abnormalities noted, AAO x 3  EKG:  01/30/14-sinus rhythm, normal intervals, nonspecific T-wave changes, inferior/lateral leads  ECHO: 01/30/14 - Left ventricle: The cavity size was normal. There was mild concentric hypertrophy. Systolic function was vigorous. The estimated ejection fraction was in the range of 65% to 70%. Calcified apical false tendon. Wall motion was normal; there were no regional wall motion abnormalities. Doppler parameters are consistent with abnormal left ventricular relaxation (grade 1 diastolic dysfunction). The E/e' ratio is <10, suggesting normal LV filling pressure. - Aortic valve: Sclerosis without stenosis. Transvalvular velocity was within the normal range. There was no stenosis. Trivial regurgitation. - Left atrium: The atrium was normal in size. - Inferior vena cava: The vessel was normal in size; the respirophasic diameter changes were in the normal range (= 50%); findings are consistent with normal central venous pressure. - Pericardium, extracardiac: There was no pericardial effusion.  ASSESSMENT AND PLAN:  1. Possible vasovagal syncope-I agree with previous assessments by both Dr. Martinique as well as Dr. Aundra Dubin and Dr. Cindi Carbon. Anxiety could be playing a role as well. I will complete the workup with a nuclear stress test. He does have occasional dyspnea as well as T-wave changes on EKG. It seems a bit bizarre that at age 68 he would begin to experience these symptoms. I do not feel strongly that he needs event monitor at this time. If symptoms recur, consider.   Signed, Candee Furbish, MD [facility]  02/12/2014 4:25 PM

## 2014-02-12 NOTE — Patient Instructions (Signed)
Your physician recommends that you continue on your current medications as directed. Please refer to the Current Medication list given to you today.  Your physician has requested that you have en exercise stress myoview. For further information please visit [email or web address]. Please follow instruction sheet, as given.  Your physician recommends that you schedule a follow-up as needed 

## 2014-02-26 ENCOUNTER — Encounter: Payer: Self-pay | Admitting: Cardiology

## 2014-02-27 ENCOUNTER — Ambulatory Visit (HOSPITAL_COMMUNITY): Payer: Medicare HMO | Attending: Internal Medicine | Admitting: Radiology

## 2014-02-27 VITALS — BP 125/84 | Ht 71.0 in | Wt 213.0 lb

## 2014-02-27 DIAGNOSIS — R0600 Dyspnea, unspecified: Secondary | ICD-10-CM

## 2014-02-27 DIAGNOSIS — R0602 Shortness of breath: Secondary | ICD-10-CM

## 2014-02-27 DIAGNOSIS — R0609 Other forms of dyspnea: Secondary | ICD-10-CM | POA: Insufficient documentation

## 2014-02-27 DIAGNOSIS — R5381 Other malaise: Secondary | ICD-10-CM | POA: Insufficient documentation

## 2014-02-27 DIAGNOSIS — R42 Dizziness and giddiness: Secondary | ICD-10-CM | POA: Insufficient documentation

## 2014-02-27 DIAGNOSIS — Z8249 Family history of ischemic heart disease and other diseases of the circulatory system: Secondary | ICD-10-CM | POA: Insufficient documentation

## 2014-02-27 DIAGNOSIS — R0989 Other specified symptoms and signs involving the circulatory and respiratory systems: Principal | ICD-10-CM | POA: Insufficient documentation

## 2014-02-27 DIAGNOSIS — R5383 Other fatigue: Secondary | ICD-10-CM

## 2014-02-27 MED ORDER — TECHNETIUM TC 99M SESTAMIBI GENERIC - CARDIOLITE
10.0000 | Freq: Once | INTRAVENOUS | Status: AC | PRN
  Administered 2014-02-27: 10 via INTRAVENOUS

## 2014-02-27 MED ORDER — TECHNETIUM TC 99M SESTAMIBI GENERIC - CARDIOLITE
30.0000 | Freq: Once | INTRAVENOUS | Status: AC | PRN
  Administered 2014-02-27: 30 via INTRAVENOUS

## 2014-02-27 NOTE — Progress Notes (Signed)
[facility] [address] [place], [place] [address] [phone]    Cardiology Nuclear Med Study  Howard Davenport is a 68 y.o. male     MRN : [id number]     DOB: 1946-03-15  Procedure Date: 02/27/2014  Nuclear Med Background Indication for Stress Test:  Evaluation for Ischemia, Abnormal EKG, and Patient seen in hospital on 01-30-2014 for Near Syncope,and 7 beat run of SVT,enzymes negative History:  No Hx of CAD, 2.15.08 MPI: NL EF: 62%, NSVT, 3/15 ECHO: EF: 65-70% Cardiac Risk Factors: Family History - CAD  Symptoms:  DOE, Light-Headedness and SOB   Nuclear Pre-Procedure Caffeine/Decaff Intake:  None. 12 hrs NPO After: 7:00pm   Lungs:  clear O2 Sat: 95% on room air. IV 0.9% NS with Angio Cath:  22g  IV Site: R Antecubital x 1, tolerated well IV Started by:  Irven Baltimore, RN  Chest Size (in):  42 Cup Size: n/a  Height: 5\' 11"  (1.803 m)  Weight:  213 lb (96.616 kg)  BMI:  Body mass index is 29.72 kg/(m^2). Tech Comments:  Patient took morning medications    Nuclear Med Study 1 or 2 day study: 1 day  Stress Test Type:  Stress  Reading MD: N/A  Order Authorizing Provider:  Candee Furbish, MD  Resting Radionuclide: Technetium 35m Sestamibi  Resting Radionuclide Dose: 11.0 mCi   Stress Radionuclide:  Technetium 79m Sestamibi  Stress Radionuclide Dose: 33.0 mCi           Stress Protocol Rest HR: 55 Stress HR: 134  Rest BP: 125/84 Stress BP: 170/79  Exercise Time (min): 8:30 METS: 10.10   Predicted Max HR: 153 bpm % Max HR: 87.58 bpm Rate Pressure Product: 22780   Dose of Adenosine (mg):  n/a Dose of Lexiscan: n/a mg  Dose of Atropine (mg): n/a Dose of Dobutamine: n/a mcg/kg/min (at max HR)  Stress Test Technologist: Perrin Maltese, EMT-P  Nuclear Technologist:  Charlton Amor, CNMT     Rest Procedure:  Myocardial perfusion imaging was performed at rest 45 minutes following the intravenous administration of Technetium 29m Sestamibi. Rest ECG:  NSR - Normal EKG  Stress Procedure:  The patient exercised on the treadmill utilizing the Bruce Protocol for 8:30 minutes. The patient stopped due to fatigue and denied any chest pain.  Technetium 16m Sestamibi was injected at peak exercise and myocardial perfusion imaging was performed after a brief delay. Stress ECG: No significant change from baseline ECG  QPS Raw Data Images:  Normal; no motion artifact; normal heart/lung ratio. Stress Images:  Normal homogeneous uptake in all areas of the myocardium. Rest Images:  Normal homogeneous uptake in all areas of the myocardium. Subtraction (SDS):  No evidence of ischemia. Transient Ischemic Dilatation (Normal <1.22):  0.93 Lung/Heart Ratio (Normal <0.45):  0.12  Quantitative Gated Spect Images QGS EDV:  100 ml QGS ESV:  36 ml  Impression Exercise Capacity:  Good exercise capacity. BP Response:  Normal blood pressure response. Clinical Symptoms:  No chest pain. ECG Impression:  No significant ST segment change suggestive of ischemia. Comparison with Prior Nuclear Study: No significant change from previous study  Overall Impression:  Normal stress nuclear study.  LV Ejection Fraction: 64%.  LV Wall Motion:  NL LV Function; NL Wall Motion  Darlin Coco  MD

## 2014-03-04 ENCOUNTER — Telehealth: Payer: Self-pay | Admitting: Family Medicine

## 2014-03-04 ENCOUNTER — Other Ambulatory Visit: Payer: Self-pay | Admitting: Family Medicine

## 2014-03-04 NOTE — Telephone Encounter (Signed)
?   OK to Refill  

## 2014-03-04 NOTE — Telephone Encounter (Signed)
Patient is requesting refill on his xanax, he says he does not thing that there is a refill on it, its [facility] [address] [phone]

## 2014-03-04 NOTE — Telephone Encounter (Signed)
ok 

## 2014-04-11 ENCOUNTER — Telehealth: Payer: Self-pay | Admitting: *Deleted

## 2014-04-11 NOTE — Telephone Encounter (Signed)
[facility] Mgmt to check on status of referral and they claimed they never received referral I refaxed for the 2nd time as 2nd request back to [facility] for authorization for pt to go to [facility] , pt appt was cancelled today d/t not having authorization back, pt was upset and I explained to him that I fax referral to [employer] on 04/04/14 I had received confirmation that it was received. Pt is going to see if he can reschedule his appt until auth is back.

## 2014-04-12 NOTE — Telephone Encounter (Signed)
Received authorization from [employer] number is [id number], form faxed to [facility] at [phone]

## 2014-04-25 ENCOUNTER — Telehealth: Payer: Self-pay | Admitting: *Deleted

## 2014-04-25 ENCOUNTER — Other Ambulatory Visit: Payer: Self-pay | Admitting: Family Medicine

## 2014-04-25 DIAGNOSIS — R51 Headache: Secondary | ICD-10-CM

## 2014-04-25 DIAGNOSIS — R42 Dizziness and giddiness: Secondary | ICD-10-CM

## 2014-04-25 NOTE — Telephone Encounter (Signed)
Called pt back to see why needs referral to neuro and he had stated that Dr. Tami Ribas ENT has mentioned to him that he should see a neurologist d/t from [facility] perspective everything is good as far as ENT. Pt says his symptoms are lightheadedness, headaches at times, head is fuzzy when it comes to thinking, sometimes he has problems with his vision worse in the am but goes all day. When pt takes 1/2 tablet his symptoms seem to ease up but still there. Pt wants to see neuro to ease his mind mostly, states few of his friends had some symptoms and found out they had brain tumor.   ?ok to place referral to neuro.

## 2014-04-25 NOTE — Telephone Encounter (Signed)
Referral for neurologist placed.

## 2014-04-25 NOTE — Telephone Encounter (Signed)
ok 

## 2014-04-25 NOTE — Telephone Encounter (Signed)
Message copied by Maureen Chatters on Thu Apr 25, 2014 12:14 PM ------      Message from: Devoria Glassing      Created: Thu Apr 25, 2014 10:15 AM       Patient would like referral to [place] neuro and he has [facility] please call him back at [phone] ------

## 2014-04-29 ENCOUNTER — Telehealth: Payer: Self-pay | Admitting: *Deleted

## 2014-04-29 NOTE — Telephone Encounter (Signed)
Submitted [employer] care mgmt thru [employer] for authorization, authorization number is [id number] referral faxed to Carlyon Shadow at [facility].

## 2014-04-30 ENCOUNTER — Encounter: Payer: Self-pay | Admitting: Neurology

## 2014-04-30 ENCOUNTER — Ambulatory Visit (INDEPENDENT_AMBULATORY_CARE_PROVIDER_SITE_OTHER): Payer: Medicare HMO | Admitting: Neurology

## 2014-04-30 VITALS — BP 117/78 | HR 69 | Ht 71.0 in | Wt 211.0 lb

## 2014-04-30 DIAGNOSIS — R42 Dizziness and giddiness: Secondary | ICD-10-CM

## 2014-04-30 NOTE — Progress Notes (Signed)
[facility]    Howard Davenport:  Dr Janann Colonel Referring Breckin Zafar: Susy Frizzle, MD Primary Care Physician:  Odette Fraction, MD  CC:  Sunday Corn headed sensation  HPI:  Howard Davenport is a 68 y.o. male here as a referral from Dr. Dennard Schaumann for light headed sensation  Started around 4 months ago. Notices it more in the morning when he stands up, eases off as the day goes on. Describes it as a "buzz" from drinking. Has difficulty getting his thoughts together, difficulty focusing, brief loss of memory. Fluctuates on a day to day basis. He has had an extensive cardiac and ENT workup which was unremarkable. His PCP thinks the symptoms may be anxiety related. Recently started on 0.25mg  xanax infrequently which seems to calm the symptoms down. He is schedule to have an inner ear exam via ENT pending.   No staring off episodes, no lip smacking, no eye blinking, no extremity twitching.   Notes some episodes of sinus pressure, occasional nasal discharge.   Review of Systems: Out of a complete 14 system review, the patient complains of only the following symptoms, and all other reviewed systems are negative. + confusion, headache, numbness  History   Social History  . Marital Status: Married    Spouse Name: N/A    Number of Children: N/A  . Years of Education: N/A   Occupational History  . Not on file.   Social History Main Topics  . Smoking status: Never Smoker   . Smokeless tobacco: Never Used  . Alcohol Use: No  . Drug Use: No  . Sexual Activity: Yes   Other Topics Concern  . Not on file   Social History Narrative   Married with 2 children   Right handed   12 th    3 cups daily    Family History  Problem Relation Age of Onset  . Leukemia Mother   . Heart disease Father     Past Medical History  Diagnosis Date  . Hiatal hernia   . Inguinal hernia   . GERD (gastroesophageal reflux disease)     Past Surgical History  Procedure Laterality Date  .  Hernia repair      Current Outpatient Prescriptions  Medication Sig Dispense Refill  . ALFALFA PO Take 2 tablets by mouth 2 (two) times daily.       Marland Kitchen ALPRAZolam (XANAX) 0.5 MG tablet TAKE 1 TABLET EVERY 8 HOURS AS NEEDED  30 tablet  1  . Carboxymethylcellulose Sodium (LUBRICANT EYE DROPS OP) Place 1 drop into both eyes 3 (three) times daily.       . clotrimazole (LOTRIMIN) 1 % cream Apply 1 application topically 2 (two) times daily as needed. Athletes feet      . ibuprofen (ADVIL,MOTRIN) 200 MG tablet Take 200 mg by mouth every 6 (six) hours as needed for moderate pain.      Marland Kitchen OVER THE COUNTER MEDICATION Take 1 capsule by mouth daily. Beta Sitosterol supplyment      . Probiotic Product (PROBIOTIC DAILY PO) Take 1 tablet by mouth daily.      . vitamin E 400 UNIT capsule Take 400 Units by mouth daily.       No current facility-administered medications for this visit.    Allergies as of 04/30/2014  . (No Known Allergies)    Vitals: BP 117/78  Pulse 69  Ht 5\' 11"  (1.803 m)  Wt 211 lb (95.709 kg)  BMI 29.44 kg/m2 Last Weight:  Wt Readings  from Last 1 Encounters:  04/30/14 211 lb (95.709 kg)   Last Height:   Ht Readings from Last 1 Encounters:  04/30/14 5\' 11"  (1.803 m)     Physical exam: Exam: Gen: NAD, conversant Eyes: anicteric sclerae, moist conjunctivae HENT: Atraumatic, oropharynx clear Neck: Trachea midline; supple,  Lungs: CTA, no wheezing, rales, rhonic                          CV: RRR, no MRG Abdomen: Soft, non-tender;  Extremities: No peripheral edema  Skin: Normal temperature, no rash,  Psych: Appropriate affect, pleasant  Neuro: MS: AA&Ox3, appropriately interactive, normal affect   Attention: WORLD backwards  Speech: fluent w/o paraphasic error  Memory: good recent and remote recall  CN: PERRL, EOMI no nystagmus, no ptosis, sensation intact to LT V1-V3 bilat, face symmetric, no weakness, hearing grossly intact, palate elevates symmetrically,  shoulder shrug 5/5 bilat,  tongue protrudes midline, no fasiculations noted.  Motor: normal bulk and tone Strength: 5/5  In all extremities  Coord: rapid alternating and point-to-point (FNF, HTS) movements intact.  Reflexes: symmetrical, bilat downgoing toes  Sens: LT intact in all extremities  Gait: posture, stance, stride and arm-swing normal. Tandem gait intact. Able to walk on heels and toes. Romberg absent.   Assessment:  After physical and neurologic examination, review of laboratory studies, imaging, neurophysiology testing and pre-existing records, assessment will be reviewed on the problem list.  Plan:  Treatment plan and additional workup will be reviewed under Problem List.  1)Dizziness 2)Confusion  67y/o gentleman presenting for initial evaluation of episodes of presyncope and confusion. Unclear etiology. Question if these are vasovagal though it would be atypical. Would consider possible sinusitis/allergy related. Improvement with xanax raises question of possible stress/anxiety playing a role. Based on description, not consistent with vertigo. Will check head CT. Follow up once completed.   Jim Like, DO  [facility] [address] [place], [place] [address]  Phone [phone] Fax [phone]

## 2014-04-30 NOTE — Patient Instructions (Signed)
Overall you are doing fairly well but I do want to suggest a few things today:   Remember to drink plenty of fluid, eat healthy meals and do not skip any meals. Try to eat protein with a every meal and eat a healthy snack such as fruit or nuts in between meals. Try to keep a regular sleep-wake schedule and try to exercise daily, particularly in the form of walking, 20-30 minutes a day, if you can.  As far as diagnostic testing:  1)I would like you to have a CT head of the brain. You will be called to schedule this  Follow up as needed. Please call us with any interim questions, concerns, problems, updates or refill requests.   Please also call us for any test results so we can go over those with you on the phone.  My clinical assistant and will answer any of your questions and relay your messages to me and also relay most of my messages to you.   Our phone number is [phone]. We also have an after hours call service for urgent matters and there is a physician on-call for urgent questions. For any emergencies you know to call 911 or go to the nearest emergency room

## 2014-05-06 ENCOUNTER — Telehealth: Payer: Self-pay | Admitting: Neurology

## 2014-05-06 NOTE — Telephone Encounter (Signed)
[facility] and they said that the order was there, ready to schedule the appt. Called patient back and informed to call them to schedule CT Scan. He verbalized understanding and said ok.

## 2014-05-06 NOTE — Telephone Encounter (Signed)
Patient calling and is very confused about his CT scan, states that the information was never faxed over to [facility] and he has not heard back from them regarding his appointment. Please return call to patient and advise.

## 2014-05-08 ENCOUNTER — Ambulatory Visit
Admission: RE | Admit: 2014-05-08 | Discharge: 2014-05-08 | Disposition: A | Discharge status: Home / Self Care | Payer: Commercial Managed Care - HMO | Source: Ambulatory Visit | Attending: Neurology | Admitting: Neurology

## 2014-05-08 DIAGNOSIS — R42 Dizziness and giddiness: Secondary | ICD-10-CM

## 2014-05-10 ENCOUNTER — Telehealth: Payer: Self-pay | Admitting: *Deleted

## 2014-05-10 NOTE — Telephone Encounter (Signed)
Pt called and is asking about CT head results.  I called and relayed the Normal CT head results to pt.  He is still having the lightheadedness, nausea.  He relayed that for the last 2 days has taken motrin 200mg  po tid and has noted 80% improvement.  ? gallbaldder ?  Atypical migraine?  Has had exposure to deer ticks recently.  Causes?  Asked about having a hearing test from ENT proceed with this?   Will forward to Dr. Janann Colonel.  Make f/u appt?

## 2014-05-14 NOTE — Telephone Encounter (Signed)
Please have him go ahead with the ENT inner ear test. I would like him to follow up with me after that test is completed. Thanks.

## 2014-05-14 NOTE — Telephone Encounter (Signed)
I called pt and relayed to have hearing test done first and then f/u with Dr. Janann Colonel afterwards.   He will call back after he sets this up.

## 2014-05-16 ENCOUNTER — Telehealth: Payer: Self-pay | Admitting: *Deleted

## 2014-05-16 NOTE — Telephone Encounter (Signed)
Pt called stating he has appointment with [facility] July 9 at 330pm and needed a referral from [facility] [place]. I submitted referral thru [employer] with authorization number [id number].

## 2014-05-20 ENCOUNTER — Ambulatory Visit: Payer: Medicare HMO | Admitting: Family Medicine

## 2014-06-10 ENCOUNTER — Telehealth: Payer: Self-pay | Admitting: Neurology

## 2014-06-10 NOTE — Telephone Encounter (Signed)
Called and spoke to Patient he will come sometime this week and Bring letter Marcie Bal he will ask for me Hinton Dyer C) and I will get letter to [place].  Dr. Janann Colonel and and Dr.Yan Please sign off if this ok for Him to switch Patient is very persistent  And stated he will call back.

## 2014-06-10 NOTE — Telephone Encounter (Signed)
Patient requesting to switch doctors. He would like to see Dr. Krista Blue as a friend of his highly recommended her. He was told he needs to write a letter requesting the change. He is having new symptoms of night sweats, the lightheadedness is worse, mild headache all the time and he cannot sleep. He is requesting the soonest possible appointment. Please call to advise.

## 2014-06-11 NOTE — Telephone Encounter (Signed)
That is fine 

## 2014-06-11 NOTE — Telephone Encounter (Signed)
Reviewed

## 2014-06-14 ENCOUNTER — Ambulatory Visit (INDEPENDENT_AMBULATORY_CARE_PROVIDER_SITE_OTHER): Payer: Commercial Managed Care - HMO | Admitting: Family Medicine

## 2014-06-14 ENCOUNTER — Encounter: Payer: Self-pay | Admitting: Family Medicine

## 2014-06-14 VITALS — BP 100/62 | HR 78 | Temp 97.1°F | Resp 16 | Ht 72.0 in | Wt 208.0 lb

## 2014-06-14 DIAGNOSIS — F411 Generalized anxiety disorder: Secondary | ICD-10-CM

## 2014-06-14 MED ORDER — ESCITALOPRAM OXALATE 10 MG PO TABS: 10.0000 mg | ORAL_TABLET | Freq: Every day | ORAL | Status: DC

## 2014-06-14 NOTE — Progress Notes (Signed)
Subjective:    Patient ID: Howard Davenport, male    DOB: 04/13/1946, 68 y.o.   MRN: [id number]  HPI Patient has been dealing with a multitude of symptoms over the last 6 months. He began with chest discomfort and shortness of breath and dyspnea on exertion. Echocardiogram revealed mild diastolic dysfunction but was otherwise normal with an ejection fraction of 60-70%. Stress Myoview was completely normal. Patient then began having vasovagal lightheadedness and presyncope. Carotid Dopplers were within normal limits. He then began having dizziness and lightheadedness on a daily basis. He also has constant sinus pressure. He has seen a neurologist who performed a head CT which came back completely normal. He is also seen in your nose and throat physician who found chronic right-sided hearing loss but otherwise had a normal air doesn't throat examination.   At present, the patient's biggest concern is that he continues to feel bad. He reports feeling weak and tired. He reports feeling dizzy on a daily basis. He reports feeling lightheaded. He reports blurry vision. He reports night sweats at night and occasional cool clammy skin. He is tried Dramamine over-the-counter which helped a little bit with the dizziness. He has also tried Xanax which helped a little bit of some of his symptoms. He is here today to discuss the next step in his diagnostic workup. Past Medical History  Diagnosis Date  . Hiatal hernia   . Inguinal hernia   . GERD (gastroesophageal reflux disease)    Current Outpatient Prescriptions on File Prior to Visit  Medication Sig Dispense Refill  . ALFALFA PO Take 2 tablets by mouth 2 (two) times daily.       Marland Kitchen ALPRAZolam (XANAX) 0.5 MG tablet TAKE 1 TABLET EVERY 8 HOURS AS NEEDED  30 tablet  1  . Carboxymethylcellulose Sodium (LUBRICANT EYE DROPS OP) Place 1 drop into both eyes 3 (three) times daily.       Marland Kitchen ibuprofen (ADVIL,MOTRIN) 200 MG tablet Take 200 mg by mouth every 6 (six) hours  as needed for moderate pain.      Marland Kitchen OVER THE COUNTER MEDICATION Take 1 capsule by mouth daily. Beta Sitosterol supplyment      . Probiotic Product (PROBIOTIC DAILY PO) Take 1 tablet by mouth daily.      . vitamin E 400 UNIT capsule Take 400 Units by mouth daily.       No current facility-administered medications on file prior to visit.   No Known Allergies History   Social History  . Marital Status: Married    Spouse Name: N/A    Number of Children: N/A  . Years of Education: N/A   Occupational History  . Not on file.   Social History Main Topics  . Smoking status: Never Smoker   . Smokeless tobacco: Never Used  . Alcohol Use: No  . Drug Use: No  . Sexual Activity: Yes   Other Topics Concern  . Not on file   Social History Narrative   Married with 2 children   Right handed   12 th    3 cups daily   Family History  Problem Relation Age of Onset  . Leukemia Mother   . Heart disease Father      Review of Systems  All other systems reviewed and are negative.      Objective:   Physical Exam  Vitals reviewed. Constitutional: He is oriented to person, place, and time. He appears well-developed and well-nourished. No distress.  Neck: Neck  supple. No JVD present. No thyromegaly present.  Cardiovascular: Normal rate, regular rhythm and normal heart sounds.  Exam reveals no gallop and no friction rub.   No murmur heard. Pulmonary/Chest: Effort normal and breath sounds normal. No respiratory distress. He has no wheezes. He has no rales. He exhibits no tenderness.  Abdominal: Soft. Bowel sounds are normal. He exhibits no distension and no mass. There is no tenderness. There is no rebound and no guarding.  Musculoskeletal: Normal range of motion. He exhibits no edema.  Lymphadenopathy:    He has no cervical adenopathy.  Neurological: He is alert and oriented to person, place, and time. He has normal reflexes. He displays normal reflexes. No cranial nerve deficit. He  exhibits normal muscle tone. Coordination normal.  Skin: He is not diaphoretic.          Assessment & Plan:  1. GAD (generalized anxiety disorder) I reviewed the patient's stress Myoview, echocardiogram, carotid Dopplers, and head CT. I have reviewed his evaluations in the emergency room and his specialists offices. Patient's TSH, CMP, CBC have all been within normal limits. We could consider Lyme titers, B12, checking a sedimentation rate. However his symptoms are vague and nonspecific. I believe this could be evidence of generalized anxiety disorder. Therefore I recommended the patient begin Lexapro 10 mg by mouth daily for one month. Recheck in 1 month and, at that time, if his symptoms persist, I would recommend an MRI of the brain, a second opinion with neurology, checking a sedimentation rate along with a B12 level, and possibly nerve conduction studies to evaluate for peripheral neuropathy as a potential cause of his disequilibrium. - escitalopram (LEXAPRO) 10 MG tablet; Take 1 tablet (10 mg total) by mouth daily.  Dispense: 30 tablet; Refill: 5

## 2014-06-18 ENCOUNTER — Telehealth: Payer: Self-pay | Admitting: Family Medicine

## 2014-06-18 NOTE — Telephone Encounter (Signed)
Patient says that the escitalopram that he was prescribed is giving him some side affects that he would like to let dr pickard know about please call him at  [phone]

## 2014-06-18 NOTE — Telephone Encounter (Signed)
Pt called concerned about some side effects he was having with the Lexapro 10mg s - he was having some nausea, increased anxiety, could not sleep and some dizziness. He did cut the pill in half and he feels much better today. I informed pt to try taking half a 10mg  = 5mg  x 1 week and if no side effects to increase it to 10mg  and take it at night before bed. Explained to him that sometimes the side effects will subside after 2 weeks however if when he increases back to the 10mg s the side effects return and are intolerable to call us back and we may have to just switch medications. Pt verbalizes understanding and will call us back if any problems occur.

## 2014-07-15 ENCOUNTER — Ambulatory Visit (INDEPENDENT_AMBULATORY_CARE_PROVIDER_SITE_OTHER): Payer: Medicare HMO | Admitting: Family Medicine

## 2014-07-15 ENCOUNTER — Encounter: Payer: Self-pay | Admitting: Family Medicine

## 2014-07-15 VITALS — BP 108/74 | HR 64 | Temp 98.0°F | Resp 16 | Ht 72.0 in | Wt 207.0 lb

## 2014-07-15 DIAGNOSIS — F411 Generalized anxiety disorder: Secondary | ICD-10-CM

## 2014-07-15 MED ORDER — VENLAFAXINE HCL ER 37.5 MG PO CP24: 37.5000 mg | ORAL_CAPSULE | Freq: Every day | ORAL | Status: DC

## 2014-07-15 NOTE — Progress Notes (Signed)
Subjective:    Patient ID: Howard Davenport, male    DOB: 05-10-1946, 68 y.o.   MRN: [id number]  HPI 06/14/14 Patient has been dealing with a multitude of symptoms over the last 6 months. He began with chest discomfort and shortness of breath and dyspnea on exertion. Echocardiogram revealed mild diastolic dysfunction but was otherwise normal with an ejection fraction of 60-70%. Stress Myoview was completely normal. Patient then began having vasovagal lightheadedness and presyncope. Carotid Dopplers were within normal limits. He then began having dizziness and lightheadedness on a daily basis. He also has constant sinus pressure. He has seen a neurologist who performed a head CT which came back completely normal. He is also seen in your nose and throat physician who found chronic right-sided hearing loss but otherwise had a normal air doesn't throat examination.   At present, the patient's biggest concern is that he continues to feel bad. He reports feeling weak and tired. He reports feeling dizzy on a daily basis. He reports feeling lightheaded. He reports blurry vision. He reports night sweats at night and occasional cool clammy skin. He is tried Dramamine over-the-counter which helped a little bit with the dizziness. He has also tried Xanax which helped a little bit of some of his symptoms. He is here today to discuss the next step in his diagnostic workup.  At that time, my plan was: 1. GAD (generalized anxiety disorder) I reviewed the patient's stress Myoview, echocardiogram, carotid Dopplers, and head CT. I have reviewed his evaluations in the emergency room and his specialists offices. Patient's TSH, CMP, CBC have all been within normal limits. We could consider Lyme titers, B12, checking a sedimentation rate. However his symptoms are vague and nonspecific. I believe this could be evidence of generalized anxiety disorder. Therefore I recommended the patient begin Lexapro 10 mg by mouth daily for one  month. Recheck in 1 month and, at that time, if his symptoms persist, I would recommend an MRI of the brain, a second opinion with neurology, checking a sedimentation rate along with a B12 level, and possibly nerve conduction studies to evaluate for peripheral neuropathy as a potential cause of his disequilibrium. - escitalopram (LEXAPRO) 10 MG tablet; Take 1 tablet (10 mg total) by mouth daily.  Dispense: 30 tablet; Refill: 5  07/15/14 Patient is here today for follow up.  Patient was unable to tolerate Lexapro 10 mg by mouth daily due to a burning discomfort in his arms. He reduced the dose to 5 mg a day. On 5 mg a day he continues to have some and burning discomfort in his arms. He also complains of decreased libido. He also reports increasing fatigue. However he feels much better. He does have some of the dizziness but it is not as severe as before. Overall he is feeling better if there were a medication that he could tolerate better. Past Medical History  Diagnosis Date  . Hiatal hernia   . Inguinal hernia   . GERD (gastroesophageal reflux disease)    Current Outpatient Prescriptions on File Prior to Visit  Medication Sig Dispense Refill  . ALFALFA PO Take 2 tablets by mouth 2 (two) times daily.       Marland Kitchen ALPRAZolam (XANAX) 0.5 MG tablet TAKE 1 TABLET EVERY 8 HOURS AS NEEDED  30 tablet  1  . Carboxymethylcellulose Sodium (LUBRICANT EYE DROPS OP) Place 1 drop into both eyes 3 (three) times daily.       Marland Kitchen escitalopram (LEXAPRO) 10 MG tablet  Take 1 tablet (10 mg total) by mouth daily.  30 tablet  5  . ibuprofen (ADVIL,MOTRIN) 200 MG tablet Take 200 mg by mouth every 6 (six) hours as needed for moderate pain.      Marland Kitchen OVER THE COUNTER MEDICATION Take 1 capsule by mouth daily. Beta Sitosterol supplyment      . Probiotic Product (PROBIOTIC DAILY PO) Take 1 tablet by mouth daily.      . vitamin E 400 UNIT capsule Take 400 Units by mouth daily.       No current facility-administered medications on file  prior to visit.   No Known Allergies History   Social History  . Marital Status: Married    Spouse Name: N/A    Number of Children: N/A  . Years of Education: N/A   Occupational History  . Not on file.   Social History Main Topics  . Smoking status: Never Smoker   . Smokeless tobacco: Never Used  . Alcohol Use: No  . Drug Use: No  . Sexual Activity: Yes   Other Topics Concern  . Not on file   Social History Narrative   Married with 2 children   Right handed   12 th    3 cups daily   Family History  Problem Relation Age of Onset  . Leukemia Mother   . Heart disease Father      Review of Systems  All other systems reviewed and are negative.      Objective:   Physical Exam  Vitals reviewed. Constitutional: He is oriented to person, place, and time. He appears well-developed and well-nourished. No distress.  Neck: Neck supple. No JVD present. No thyromegaly present.  Cardiovascular: Normal rate, regular rhythm and normal heart sounds.  Exam reveals no gallop and no friction rub.   No murmur heard. Pulmonary/Chest: Effort normal and breath sounds normal. No respiratory distress. He has no wheezes. He has no rales. He exhibits no tenderness.  Abdominal: Soft. Bowel sounds are normal. He exhibits no distension and no mass. There is no tenderness. There is no rebound and no guarding.  Musculoskeletal: Normal range of motion. He exhibits no edema.  Lymphadenopathy:    He has no cervical adenopathy.  Neurological: He is alert and oriented to person, place, and time. He has normal reflexes. No cranial nerve deficit. He exhibits normal muscle tone. Coordination normal.  Skin: He is not diaphoretic.          Assessment & Plan:  1. GAD (generalized anxiety disorder)  discontinue Lexapro and replaced with Effexor XR 37.5 mg by mouth every morning. Increase to 75 mg in 2 weeks if the patient is tolerating the medication without difficulty and if his symptoms  necessitate a higher dose.

## 2014-07-26 ENCOUNTER — Telehealth: Payer: Self-pay | Admitting: Family Medicine

## 2014-07-26 MED ORDER — VENLAFAXINE HCL ER 75 MG PO CP24: 75.0000 mg | ORAL_CAPSULE | Freq: Every day | ORAL | Status: DC

## 2014-07-26 NOTE — Telephone Encounter (Signed)
Pt called and states that he has increased his Effexor to 75mg  and is doing great on it and need a rx sent in for the 75mg  as he is almost out of the 37.5mg  capsules. Per LOV ok to do. Med sent to pharm and pt aware.

## 2014-09-03 ENCOUNTER — Ambulatory Visit (INDEPENDENT_AMBULATORY_CARE_PROVIDER_SITE_OTHER): Payer: Medicare HMO | Admitting: Family Medicine

## 2014-09-03 DIAGNOSIS — Z23 Encounter for immunization: Secondary | ICD-10-CM

## 2014-09-19 ENCOUNTER — Telehealth: Payer: Self-pay | Admitting: *Deleted

## 2014-09-19 NOTE — Telephone Encounter (Signed)
Submitted humana referral thru acuity connect for authorization for [facility] Dermat[facility] Dr. Jarome Matin, MD, referral is pending.

## 2014-11-06 ENCOUNTER — Other Ambulatory Visit: Payer: Medicare HMO

## 2014-11-06 DIAGNOSIS — W57XXXA Bitten or stung by nonvenomous insect and other nonvenomous arthropods, initial encounter: Secondary | ICD-10-CM

## 2014-11-12 LAB — B. BURGDORFI ANTIBODIES BY WB
B BURGDORFERI IGG ABS (IB): NEGATIVE
B burgdorferi IgM Abs (IB): NEGATIVE

## 2014-11-13 LAB — ROCKY MTN SPOTTED FVR ABS PNL(IGG+IGM)
RMSF IGM: 0.36 IV
RMSF IgG: 0.78 IV

## 2014-11-14 ENCOUNTER — Encounter: Payer: Self-pay | Admitting: *Deleted

## 2014-11-23 ENCOUNTER — Other Ambulatory Visit: Payer: Self-pay | Admitting: Family Medicine

## 2015-03-24 ENCOUNTER — Telehealth: Payer: Self-pay | Admitting: Family Medicine

## 2015-03-24 NOTE — Telephone Encounter (Signed)
Patient is calling to say he needs a humana referral for [facility] she has this month  Please call him with questions at [phone]

## 2015-04-01 NOTE — Telephone Encounter (Signed)
Left message for patient to return my call so I can find out who he is seeing at [facility] urology before I can place a referral. Patient called back before I put this message in.   He states he is seeing Dr. Junious Silk on Apr 07, 2015 at 9:00 for routine visit on prostate. I have called them at [phone] to find out what dx code to use for visit. Per Maudie Mercury she states N40.0 is the dx code.   I have submitted the referral.  Received fax from [place] back Auth# [id number] Treating Provider Dr. Festus Aloe # of Visits 6 Start Date 04/07/15 End Date 10/04/15 CPT 99499 DX N40.0

## 2015-04-04 ENCOUNTER — Telehealth: Payer: Self-pay | Admitting: *Deleted

## 2015-04-04 DIAGNOSIS — D2261 Melanocytic nevi of right upper limb, including shoulder: Secondary | ICD-10-CM | POA: Diagnosis not present

## 2015-04-04 DIAGNOSIS — L578 Other skin changes due to chronic exposure to nonionizing radiation: Secondary | ICD-10-CM | POA: Diagnosis not present

## 2015-04-04 DIAGNOSIS — D1801 Hemangioma of skin and subcutaneous tissue: Secondary | ICD-10-CM | POA: Diagnosis not present

## 2015-04-04 DIAGNOSIS — D225 Melanocytic nevi of trunk: Secondary | ICD-10-CM | POA: Diagnosis not present

## 2015-04-04 DIAGNOSIS — D2272 Melanocytic nevi of left lower limb, including hip: Secondary | ICD-10-CM | POA: Diagnosis not present

## 2015-04-04 DIAGNOSIS — L570 Actinic keratosis: Secondary | ICD-10-CM | POA: Diagnosis not present

## 2015-04-04 DIAGNOSIS — L821 Other seborrheic keratosis: Secondary | ICD-10-CM | POA: Diagnosis not present

## 2015-04-04 DIAGNOSIS — I8392 Asymptomatic varicose veins of left lower extremity: Secondary | ICD-10-CM | POA: Diagnosis not present

## 2015-04-04 NOTE — Telephone Encounter (Signed)
Submitted humana referral thru acuity connect for authorization to Dr.Drew Ronnald [facility] Dermatologist with authorization number [id number]  Requesting provider: Flonnie Hailstone  Treating provider: Glorianne Manchester  Number of visits:6  Start Date:04/04/15  End Date:10/01/15  Dx:L57..0  I called and spoke to Lelon Frohlich at [facility] and verbally gave authorization number.

## 2015-04-07 DIAGNOSIS — N5201 Erectile dysfunction due to arterial insufficiency: Secondary | ICD-10-CM | POA: Diagnosis not present

## 2015-04-07 DIAGNOSIS — N400 Enlarged prostate without lower urinary tract symptoms: Secondary | ICD-10-CM | POA: Diagnosis not present

## 2015-04-21 ENCOUNTER — Encounter: Payer: Self-pay | Admitting: Family Medicine

## 2015-04-21 ENCOUNTER — Ambulatory Visit (INDEPENDENT_AMBULATORY_CARE_PROVIDER_SITE_OTHER): Payer: Commercial Managed Care - HMO | Admitting: Family Medicine

## 2015-04-21 VITALS — BP 118/64 | HR 66 | Temp 98.4°F | Resp 12 | Ht 72.0 in | Wt 219.0 lb

## 2015-04-21 DIAGNOSIS — W57XXXA Bitten or stung by nonvenomous insect and other nonvenomous arthropods, initial encounter: Secondary | ICD-10-CM

## 2015-04-21 DIAGNOSIS — T148 Other injury of unspecified body region: Secondary | ICD-10-CM

## 2015-04-21 DIAGNOSIS — G4489 Other headache syndrome: Secondary | ICD-10-CM

## 2015-04-21 LAB — CBC WITH DIFFERENTIAL/PLATELET
Basophils Absolute: 0.1 10*3/uL (ref 0.0–0.1)
Basophils Relative: 1 % (ref 0–1)
Eosinophils Absolute: 0.2 10*3/uL (ref 0.0–0.7)
Eosinophils Relative: 3 % (ref 0–5)
HCT: 46.7 % (ref 39.0–52.0)
Hemoglobin: 15.8 g/dL (ref 13.0–17.0)
Lymphocytes Relative: 31 % (ref 12–46)
Lymphs Abs: 2 10*3/uL (ref 0.7–4.0)
MCH: 31 pg (ref 26.0–34.0)
MCHC: 33.8 g/dL (ref 30.0–36.0)
MCV: 91.6 fL (ref 78.0–100.0)
MONO ABS: 0.5 10*3/uL (ref 0.1–1.0)
MONOS PCT: 8 % (ref 3–12)
MPV: 9.8 fL (ref 8.6–12.4)
NEUTROS ABS: 3.8 10*3/uL (ref 1.7–7.7)
Neutrophils Relative %: 57 % (ref 43–77)
Platelets: 191 10*3/uL (ref 150–400)
RBC: 5.1 MIL/uL (ref 4.22–5.81)
RDW: 13.9 % (ref 11.5–15.5)
WBC: 6.6 10*3/uL (ref 4.0–10.5)

## 2015-04-21 LAB — COMPREHENSIVE METABOLIC PANEL
ALT: 14 U/L (ref 0–53)
AST: 14 U/L (ref 0–37)
Albumin: 3.9 g/dL (ref 3.5–5.2)
Alkaline Phosphatase: 59 U/L (ref 39–117)
BILIRUBIN TOTAL: 0.4 mg/dL (ref 0.2–1.2)
BUN: 24 mg/dL — ABNORMAL HIGH (ref 6–23)
CALCIUM: 9 mg/dL (ref 8.4–10.5)
CHLORIDE: 103 meq/L (ref 96–112)
CO2: 27 mEq/L (ref 19–32)
CREATININE: 1.09 mg/dL (ref 0.50–1.35)
Glucose, Bld: 106 mg/dL — ABNORMAL HIGH (ref 70–99)
Potassium: 4.8 mEq/L (ref 3.5–5.3)
SODIUM: 136 meq/L (ref 135–145)
Total Protein: 6.8 g/dL (ref 6.0–8.3)

## 2015-04-21 NOTE — Patient Instructions (Signed)
We will call with lab results Continue current medications

## 2015-04-21 NOTE — Progress Notes (Signed)
Patient ID: Howard Davenport, male   DOB: 02-04-1946, 69 y.o.   MRN: [id number]   Subjective:    Patient ID: Howard Davenport, male    DOB: Apr 13, 1946, 69 y.o.   MRN: [id number]  Patient presents for Blood test  Pt here for lyme blood test, states he has pulled off many deer ticks over pat few months, last pulled tick off 1 week ago,some areas were red others were not. Past 2 weeks he has had a mild headache, no fever, no joint pain, has also been a little fatigued. Has sinus problems but just "deals with it".  He then brought up not sure if he just needs to go back on the lexapro, which he was being treated for GAD in the past.    Review Of Systems:  GEN- + fatigue, fever, weight loss,weakness, recent illness HEENT- denies eye drainage, change in vision, nasal discharge, CVS- denies chest pain, palpitations RESP- denies SOB, cough, wheeze ABD- denies N/V, change in stools, abd pain GU- denies dysuria, hematuria, dribbling, incontinence MSK- denies joint pain, muscle aches, injury Neuro- + headache, dizziness, syncope, seizure activity       Objective:    BP 118/64 mmHg  Pulse 66  Temp(Src) 98.4 F (36.9 C) (Oral)  Resp 12  Ht 6' (1.829 m)  Wt 219 lb (99.338 kg)  BMI 29.70 kg/m2 GEN- NAD, alert and oriented x3 HEENT- PERRL, EOMI, non injected sclera, pink conjunctiva, MMM, oropharynx clear, no maxillary sinus tenderness, fundus benign appearing Neck- Supple, no thyromegaly CVS- RRR, no murmur RESP-CTAB Psych- normal affect and mood Skin- in tact, no leisions EXT- No edema Pulses- Radial, 2+        Assessment & Plan:      Problem List Items Addressed This Visit    None    Visit Diagnoses    Tick bite    -  Primary    Doubt mild HA due to lyme, no other systemic symptoms, will obtain titers, CBC, CMET as well. No OTC meds taken for HA as not bad enough. allergic sinus disease could also be problem    Relevant Orders    B. Burgdorfi Antibodies by WB    Other headache  syndrome        Relevant Orders    CBC with Differential    Comprehensive metabolic panel       Note: This dictation was prepared with Dragon dictation along with smaller phrase technology. Any transcriptional errors that result from this process are unintentional.

## 2015-04-22 LAB — B. BURGDORFI ANTIBODIES BY WB
B BURGDORFERI IGG ABS (IB): NEGATIVE
B burgdorferi IgM Abs (IB): NEGATIVE

## 2015-04-28 ENCOUNTER — Telehealth: Payer: Self-pay | Admitting: Family Medicine

## 2015-04-28 NOTE — Telephone Encounter (Signed)
Patient calling about lab results  [phone]

## 2015-04-28 NOTE — Telephone Encounter (Signed)
Call placed to patient and patient made aware.  

## 2015-05-12 ENCOUNTER — Other Ambulatory Visit: Payer: Self-pay

## 2015-09-02 ENCOUNTER — Encounter: Payer: Self-pay | Admitting: Podiatry

## 2015-09-02 ENCOUNTER — Ambulatory Visit (INDEPENDENT_AMBULATORY_CARE_PROVIDER_SITE_OTHER): Payer: Commercial Managed Care - HMO | Admitting: Podiatry

## 2015-09-02 ENCOUNTER — Telehealth: Payer: Self-pay | Admitting: *Deleted

## 2015-09-02 ENCOUNTER — Ambulatory Visit (INDEPENDENT_AMBULATORY_CARE_PROVIDER_SITE_OTHER): Payer: Commercial Managed Care - HMO

## 2015-09-02 ENCOUNTER — Ambulatory Visit (INDEPENDENT_AMBULATORY_CARE_PROVIDER_SITE_OTHER): Payer: Commercial Managed Care - HMO | Admitting: Family Medicine

## 2015-09-02 VITALS — BP 149/81 | HR 69 | Resp 18

## 2015-09-02 DIAGNOSIS — M722 Plantar fascial fibromatosis: Secondary | ICD-10-CM

## 2015-09-02 DIAGNOSIS — Z23 Encounter for immunization: Secondary | ICD-10-CM | POA: Diagnosis not present

## 2015-09-02 DIAGNOSIS — M79673 Pain in unspecified foot: Secondary | ICD-10-CM

## 2015-09-02 NOTE — Progress Notes (Signed)
   Subjective:    Patient ID: Howard Davenport, male    DOB: 13-May-1946, 69 y.o.   MRN: [id number]  HPI  69 year old male presents the office with concerns of left heel pain which is been ongoing for approximately 3 weeks. He states he has pain in the morning when he first gets up after periods of rest which is relieved by ambulation. He denies any numbness or tingling or any burning sensation. He denies any swelling or redness. No recent injury or trauma. He states the pain was a gradual onset causes a sore sensation. He previously has had plantar fasciitis in the same foot several years ago which he had inserts made and this seemed to alleviate his symptoms. He would like to talk about inserts today. No other complaints at this time.   Review of Systems  All other systems reviewed and are negative.      Objective:   Physical Exam General: AAO x3, NAD  Dermatological: Skin is warm, dry and supple bilateral. Nails x 10 are well manicured; remaining integument appears unremarkable at this time. There are no open sores, no preulcerative lesions, no rash or signs of infection present.  Vascular: Dorsalis Pedis artery and Posterior Tibial artery pedal pulses are 2/4 bilateral with immedate capillary fill time. Pedal hair growth present. No varicosities and no lower extremity edema present bilateral. There is no pain with calf compression, swelling, warmth, erythema.   Neruologic: Grossly intact via light touch bilateral. Vibratory intact via tuning fork bilateral. Protective threshold with Semmes Wienstein monofilament intact to all pedal sites bilateral. Patellar and Achilles deep tendon reflexes 2+ bilateral. No Babinski or clonus noted bilateral.   Musculoskeletal: No gross boney pedal deformities bilateral. There is tenderness palpation on the plantar medial tubercle of the left calcaneus at the insertion the plantar fascia. There is no pain on the course of plantar fascial within the arch of the  foot. There is no pain on the course/insertion of the Achilles tendon and Thompson test is negative. No pain with lateral compression of the calcaneus. No other areas of pinpoint bony tenderness or pain the vibratory sensation to bilateral lower extremities. There is no pain along the medial or lateral aspect of the ankle. No pain, crepitus, or limitation noted with foot and ankle range of motion bilateral. Muscular strength 5/5 in all groups tested bilateral.  Gait: Unassisted, Nonantalgic.      Assessment & Plan:  69 year old male with left foot heel pain, likely result of plantar fasciitis -X-rays were obtained and reviewed with the patient.  -Treatment options discussed including all alternatives, risks, and complications -Etiology of symptoms were discussed -I discussed steroid injection however he wishes to hold off. -He's been taking over-the-counter ibuprofen. He does not want any other anti-inflammatories prescribed. -Discussed orthotics. Power steps were dispensed -Discussed shoe gear modifications and recommended to wear supportive shoe at all times. -Ice and elevation -Follow-up in 3-4 weeks or sooner if any problems arise. In the meantime, encouraged to call the office with any questions, concerns, change in symptoms.   Celesta Gentile, DPM

## 2015-09-02 NOTE — Patient Instructions (Signed)

## 2015-09-02 NOTE — Telephone Encounter (Signed)
Submitted humana referral thru acuity connect for authorization on 09/01/15 to Dr. 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 Jacqualyn Posey office for review

## 2015-09-04 ENCOUNTER — Encounter: Payer: Self-pay | Admitting: Podiatry

## 2015-09-30 ENCOUNTER — Ambulatory Visit: Payer: Commercial Managed Care - HMO | Admitting: Podiatry

## 2015-10-28 DIAGNOSIS — J029 Acute pharyngitis, unspecified: Secondary | ICD-10-CM | POA: Diagnosis not present

## 2015-11-19 ENCOUNTER — Telehealth: Payer: Self-pay | Admitting: *Deleted

## 2015-11-19 NOTE — Telephone Encounter (Signed)
Submitted humana referral thru acuity connect for authorization on 10/28/15 to Dr. Raeanne Barry with authorization [id number] [phone]  Requesting provider: Flonnie Hailstone  Treating provider: Raeanne Barry  Number of visits: 6  Start Date:10/28/15  End Date: 04/25/16  Dx: [place] Main.9-Acute Pharnygitis, unspecified

## 2015-12-05 ENCOUNTER — Telehealth: Payer: Self-pay | Admitting: Family Medicine

## 2015-12-05 NOTE — Telephone Encounter (Signed)
Pt would like for Dr. Dennard Schaumann to call him regarding "heart-related issues". [phone]

## 2016-02-24 ENCOUNTER — Encounter: Payer: Self-pay | Admitting: Family Medicine

## 2016-02-24 ENCOUNTER — Ambulatory Visit (INDEPENDENT_AMBULATORY_CARE_PROVIDER_SITE_OTHER): Payer: Commercial Managed Care - HMO | Admitting: Family Medicine

## 2016-02-24 VITALS — BP 132/76 | HR 80 | Temp 98.3°F | Resp 18 | Ht 72.0 in | Wt 218.0 lb

## 2016-02-24 DIAGNOSIS — M6208 Separation of muscle (nontraumatic), other site: Secondary | ICD-10-CM

## 2016-02-24 NOTE — Progress Notes (Signed)
   Subjective:    Patient ID: Howard Davenport, male    DOB: 02-20-1946, 69 y.o.   MRN: [id number]  HPI  Patient is concerned about of midline ventral hernia that he has. He states that he has been told that he has a hernia from his breastbone to his belly button. It has been there as long as he can remember. On examination today he has a diastases recti. At the inferior pole of the diastases recti is his umbilicus. He does have a small umbilical hernia approximately 2-3 cm in diameter. Past Medical History  Diagnosis Date  . Hiatal hernia   . Inguinal hernia   . GERD (gastroesophageal reflux disease)    Past Surgical History  Procedure Laterality Date  . Hernia repair     Current Outpatient Prescriptions on File Prior to Visit  Medication Sig Dispense Refill  . ALFALFA PO Take 2 tablets by mouth 2 (two) times daily.     . Probiotic Product (PROBIOTIC DAILY PO) Take 1 tablet by mouth daily.    . vitamin E 400 UNIT capsule Take 400 Units by mouth daily.    Marland Kitchen ibuprofen (ADVIL,MOTRIN) 200 MG tablet Take 200 mg by mouth every 6 (six) hours as needed for moderate pain.    Marland Kitchen OVER THE COUNTER MEDICATION Take 1 capsule by mouth daily. Beta Sitosterol supplyment     No current facility-administered medications on file prior to visit.   No Known Allergies Social History   Social History  . Marital Status: Married    Spouse Name: N/A  . Number of Children: N/A  . Years of Education: N/A   Occupational History  . Not on file.   Social History Main Topics  . Smoking status: Never Smoker   . Smokeless tobacco: Never Used  . Alcohol Use: No  . Drug Use: No  . Sexual Activity: Yes   Other Topics Concern  . Not on file   Social History Narrative   Married with 2 children   Right handed   12 th    3 cups daily       Review of Systems  All other systems reviewed and are negative.      Objective:   Physical Exam  Cardiovascular: Normal rate, regular rhythm and normal heart  sounds.   Pulmonary/Chest: Effort normal and breath sounds normal. No respiratory distress. He has no wheezes. He has no rales.  Abdominal: Bowel sounds are normal. He exhibits no distension and no mass. There is no tenderness. There is no rebound and no guarding.  Vitals reviewed.         Assessment & Plan:  Diastasis recti  The area the patient is concerned about is actually a diastases recti. I explained to the patient the natural history of this and that it is not a hernia and cannot be corrected by surgery. He does have a small umbilical hernia that could be amenable to surgical correction. However at the present time it is asymptomatic and he decides just to watch it. He is relieved to know that he does not have a large ventral hernia

## 2016-04-09 DIAGNOSIS — L812 Freckles: Secondary | ICD-10-CM | POA: Diagnosis not present

## 2016-04-09 DIAGNOSIS — L570 Actinic keratosis: Secondary | ICD-10-CM | POA: Diagnosis not present

## 2016-04-09 DIAGNOSIS — D2271 Melanocytic nevi of right lower limb, including hip: Secondary | ICD-10-CM | POA: Diagnosis not present

## 2016-04-09 DIAGNOSIS — D225 Melanocytic nevi of trunk: Secondary | ICD-10-CM | POA: Diagnosis not present

## 2016-04-09 DIAGNOSIS — D1801 Hemangioma of skin and subcutaneous tissue: Secondary | ICD-10-CM | POA: Diagnosis not present

## 2016-04-09 DIAGNOSIS — L821 Other seborrheic keratosis: Secondary | ICD-10-CM | POA: Diagnosis not present

## 2016-04-09 DIAGNOSIS — D2272 Melanocytic nevi of left lower limb, including hip: Secondary | ICD-10-CM | POA: Diagnosis not present

## 2016-06-23 ENCOUNTER — Ambulatory Visit (INDEPENDENT_AMBULATORY_CARE_PROVIDER_SITE_OTHER): Payer: Commercial Managed Care - HMO | Admitting: Physician Assistant

## 2016-06-23 ENCOUNTER — Encounter: Payer: Self-pay | Admitting: Physician Assistant

## 2016-06-23 VITALS — BP 118/64 | HR 82 | Temp 98.7°F | Resp 16 | Ht 72.0 in | Wt 217.0 lb

## 2016-06-23 DIAGNOSIS — T148 Other injury of unspecified body region: Secondary | ICD-10-CM | POA: Diagnosis not present

## 2016-06-23 DIAGNOSIS — M609 Myositis, unspecified: Secondary | ICD-10-CM | POA: Diagnosis not present

## 2016-06-23 DIAGNOSIS — R519 Headache, unspecified: Secondary | ICD-10-CM

## 2016-06-23 DIAGNOSIS — IMO0001 Reserved for inherently not codable concepts without codable children: Secondary | ICD-10-CM

## 2016-06-23 DIAGNOSIS — R5383 Other fatigue: Secondary | ICD-10-CM

## 2016-06-23 DIAGNOSIS — W57XXXA Bitten or stung by nonvenomous insect and other nonvenomous arthropods, initial encounter: Secondary | ICD-10-CM

## 2016-06-23 DIAGNOSIS — R51 Headache: Secondary | ICD-10-CM | POA: Diagnosis not present

## 2016-06-23 DIAGNOSIS — M791 Myalgia: Secondary | ICD-10-CM

## 2016-06-23 LAB — CBC WITH DIFFERENTIAL/PLATELET
BASOS PCT: 0 %
Basophils Absolute: 0 cells/uL (ref 0–200)
EOS PCT: 1 %
Eosinophils Absolute: 69 cells/uL (ref 15–500)
HEMATOCRIT: 45 % (ref 38.5–50.0)
HEMOGLOBIN: 15.2 g/dL (ref 13.0–17.0)
LYMPHS ABS: 966 {cells}/uL (ref 850–3900)
LYMPHS PCT: 14 %
MCH: 30.9 pg (ref 27.0–33.0)
MCHC: 33.8 g/dL (ref 32.0–36.0)
MCV: 91.5 fL (ref 80.0–100.0)
MONO ABS: 414 {cells}/uL (ref 200–950)
MPV: 9.9 fL (ref 7.5–12.5)
Monocytes Relative: 6 %
Neutro Abs: 5451 cells/uL (ref 1500–7800)
Neutrophils Relative %: 79 %
Platelets: 169 10*3/uL (ref 140–400)
RBC: 4.92 MIL/uL (ref 4.20–5.80)
RDW: 14.2 % (ref 11.0–15.0)
WBC: 6.9 10*3/uL (ref 3.8–10.8)

## 2016-06-23 LAB — TSH: TSH: 0.88 m[IU]/L (ref 0.40–4.50)

## 2016-06-23 MED ORDER — DOXYCYCLINE HYCLATE 100 MG PO TABS: 100.0000 mg | ORAL_TABLET | Freq: Two times a day (BID) | ORAL | 0 refills | Status: DC

## 2016-06-23 NOTE — Progress Notes (Signed)
Patient ID: Howard Davenport MRN: [id number], DOB: 30-Sep-1946, 70 y.o. Date of Encounter: @DATE @  Chief Complaint:  Chief Complaint  Patient presents with  . Fatigue  . Headache    Begins in the evenings, states when this happens he feels like 'half his intelligence goes away" - very foggy   . Other    Joint stiffness and pain     HPI: 70 y.o. year old white male  presents with above.   He reports that 4 - 6 years ago he had similar symptoms and went to neurology and had lots of tests that all came back negative. Says that eventually the symptoms "eased and he just lived with it".  Also says that  "at one point Dr. Dennard Schaumann prescribed a medication but all it did was make me sleepy "  Also says that in the past he tested positive for [facility] spotted fever.  Says that he does get lots of tics all the time. Says that he has not found a tick on him recently.  Says that for about 2 days now he has been having achy discomfort in his shoulders and his hips. Says that today he has felt a little nausea. Says that over the past 1 week some evenings he fills "brain fog"--- says that for example if he was having a conversation he would just skirt around the periphery and not get into any details.  I asked if he feels that he is getting enough sleep. Says that he wakes up at 6 AM 7 AM. Goes to bed around midnight. Asked if he feels that he is having good sleep during those hours. Says that the past couple of nights he has been having to flip from side to side because of hip pain. He will lie on the left side for a while than the left hip hurts so then he flips to the right side. Flips from side to side throughout the night.  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 [facility]. However says that all of this activity is his usual routine and that his sleep is his usual routine other than the decreased sleep just the last couple of nights secondary to the achiness in his hips.  Has seen no rash on his skin. Has had no fevers or chills. Has had no nasal congestion or mucus from the nose. No chest congestion or cough. No vomiting or diarrhea. No abdominal pain. Mild nausea -- just today.   Past Medical History:  Diagnosis Date  . GERD (gastroesophageal reflux disease)   . Hiatal hernia   . Inguinal hernia      Home Meds: Outpatient Medications Prior to Visit  Medication Sig Dispense Refill  . OVER THE COUNTER MEDICATION Take 1 capsule by mouth daily. Beta Sitosterol supplyment    . Probiotic Product (PROBIOTIC DAILY PO) Take 1 tablet by mouth daily.    . vitamin E 400 UNIT capsule Take 400 Units by mouth daily.    Marland Kitchen ALFALFA PO Take 2 tablets by mouth 2 (two) times daily.     Marland Kitchen ibuprofen (ADVIL,MOTRIN) 200 MG tablet Take 200 mg by mouth every 6 (six) hours as needed for moderate pain.     No facility-administered  medications prior to visit.     Allergies: No Known Allergies  Social History   Social History  . Marital status: Married    Spouse name: N/A  . Number of children: N/A  . Years of education: N/A   Occupational History  . Not on file.   Social History Main Topics  . Smoking status: Never Smoker  . Smokeless tobacco: Never Used  . Alcohol use No  . Drug use: No  . Sexual activity: Yes   Other Topics Concern  . Not on file   Social History Narrative   Married with 2 children   Right handed   12 th    3 cups daily    Family History  Problem Relation Age of Onset  . Leukemia Mother   . Heart disease Father      Review of Systems:  See HPI for pertinent ROS. All other ROS negative.    Physical Exam: Blood pressure 118/64, pulse 82, temperature 98.7 F (37.1 C), temperature source Oral, resp. rate 16, height 6' (1.829  m), weight 217 lb (98.4 kg)., Body mass index is 29.43 kg/m. General: Appears in no acute distress. Neck: Supple. No thyromegaly. No lymphadenopathy. Lungs: Clear bilaterally to auscultation without wheezes, rales, or rhonchi. Breathing is unlabored. Heart: RRR with S1 S2. No murmurs, rubs, or gallops. Abdomen: Soft, non-tender, non-distended with normoactive bowel sounds. No hepatomegaly. No rebound/guarding. No obvious abdominal masses. Musculoskeletal:  Strength and tone normal for age. Extremities/Skin: Warm and dry.  No rashes. Neuro: Alert and oriented X 3. Moves all extremities spontaneously. Gait is normal. CNII-XII grossly in tact. Psych:  Responds to questions appropriately with a normal affect.     ASSESSMENT AND PLAN:  70 y.o. year old male with  1. Tick bite He is to go ahead and start doxycycline. Take as directed. Will obtain labs and will follow up with him once we get results. Also recommend that he Get more sleep and go to bed earlier. Should get 8 or 9 hours of sleep per night. Also needs stretching on a daily basis.  - ANA - CBC with Differential/Platelet - COMPLETE METABOLIC PANEL WITH GFR - B. burgdorfi antibodies by WB - Rocky mtn spotted fvr abs pnl(IgG+IgM) - Rheumatoid factor - TSH - doxycycline (VIBRA-TABS) 100 MG tablet; Take 1 tablet (100 mg total) by mouth 2 (two) times daily.  Dispense: 42 tablet; Refill: 0  2. Myalgia and myositis - ANA - CBC with Differential/Platelet - COMPLETE METABOLIC PANEL WITH GFR - B. burgdorfi antibodies by WB - Rocky mtn spotted fvr abs pnl(IgG+IgM) - Rheumatoid factor - TSH - doxycycline (VIBRA-TABS) 100 MG tablet; Take 1 tablet (100 mg total) by mouth 2 (two) times daily.  Dispense: 42 tablet; Refill: 0  3. Nonintractable headache, unspecified chronicity pattern, unspecified headache type - ANA - CBC with Differential/Platelet - COMPLETE METABOLIC PANEL WITH GFR - B. burgdorfi antibodies by WB - Rocky mtn  spotted fvr abs pnl(IgG+IgM) - Rheumatoid factor - TSH - doxycycline (VIBRA-TABS) 100 MG tablet; Take 1 tablet (100 mg total) by mouth 2 (two) times daily.  Dispense: 42 tablet; Refill: 0  4. Other fatigue - ANA - CBC with Differential/Platelet - COMPLETE METABOLIC PANEL WITH GFR - B. burgdorfi antibodies by WB - Rocky mtn spotted fvr abs pnl(IgG+IgM) - Rheumatoid factor - TSH - doxycycline (VIBRA-TABS) 100 MG tablet; Take 1 tablet (100 mg total) by mouth 2 (two) times daily.  Dispense: 42 tablet; Refill: 0   Signed, Stanton Kidney  [place], [place], BSFM 06/23/2016 2:42 PM

## 2016-06-24 LAB — COMPLETE METABOLIC PANEL WITH GFR
ALT: 11 U/L (ref 9–46)
AST: 12 U/L (ref 10–35)
Albumin: 4 g/dL (ref 3.6–5.1)
Alkaline Phosphatase: 56 U/L (ref 40–115)
BUN: 13 mg/dL (ref 7–25)
CALCIUM: 9.2 mg/dL (ref 8.6–10.3)
CHLORIDE: 100 mmol/L (ref 98–110)
CO2: 25 mmol/L (ref 20–31)
CREATININE: 1.03 mg/dL (ref 0.70–1.25)
GFR, Est African American: 85 mL/min (ref >=60)
GFR, Est Non African American: 74 mL/min (ref >=60)
GLUCOSE: 138 mg/dL — AB (ref 70–99)
Potassium: 4.6 mmol/L (ref 3.5–5.3)
SODIUM: 137 mmol/L (ref 135–146)
Total Bilirubin: 0.5 mg/dL (ref 0.2–1.2)
Total Protein: 6.6 g/dL (ref 6.1–8.1)

## 2016-06-24 LAB — RHEUMATOID FACTOR: Rhuematoid fact SerPl-aCnc: <10 IU/mL (ref <=14)

## 2016-06-24 LAB — ANA: Anti Nuclear Antibody(ANA): NEGATIVE

## 2016-06-28 LAB — ROCKY MTN SPOTTED FVR ABS PNL(IGG+IGM)
RMSF IGG: NOT DETECTED
RMSF IgM: NOT DETECTED

## 2016-06-30 LAB — LYME ABY, WSTRN BLT IGG & IGM W/BANDS
B BURGDORFERI IGG ABS (IB): NEGATIVE
B BURGDORFERI IGM ABS (IB): NEGATIVE
LYME DISEASE 23 KD IGM: NONREACTIVE
LYME DISEASE 30 KD IGG: NONREACTIVE
LYME DISEASE 39 KD IGG: NONREACTIVE
LYME DISEASE 41 KD IGG: NONREACTIVE
LYME DISEASE 45 KD IGG: NONREACTIVE
LYME DISEASE 58 KD IGG: NONREACTIVE
LYME DISEASE 66 KD IGG: NONREACTIVE
LYME DISEASE 93 KD IGG: NONREACTIVE
Lyme Disease 18 kD IgG: NONREACTIVE
Lyme Disease 23 kD IgG: NONREACTIVE
Lyme Disease 28 kD IgG: NONREACTIVE
Lyme Disease 39 kD IgM: NONREACTIVE
Lyme Disease 41 kD IgM: NONREACTIVE

## 2016-08-12 ENCOUNTER — Telehealth: Payer: Self-pay | Admitting: Family Medicine

## 2016-08-12 NOTE — Telephone Encounter (Signed)
Diclofenac 75 mg pobid 60 0 rf

## 2016-08-12 NOTE — Telephone Encounter (Signed)
Patient left vm asking for an antiinflammatory   For hip pain. He states he has been taking advil but he doesn't want to have to continue taking that.   CB# [phone]

## 2016-08-13 MED ORDER — DICLOFENAC SODIUM 75 MG PO TBEC: 75.0000 mg | DELAYED_RELEASE_TABLET | Freq: Two times a day (BID) | ORAL | 5 refills | Status: DC

## 2016-08-13 NOTE — Telephone Encounter (Signed)
Patient aware of providers recommendations.  

## 2016-09-02 ENCOUNTER — Ambulatory Visit (INDEPENDENT_AMBULATORY_CARE_PROVIDER_SITE_OTHER): Payer: Commercial Managed Care - HMO | Admitting: *Deleted

## 2016-09-02 ENCOUNTER — Encounter: Payer: Self-pay | Admitting: *Deleted

## 2016-09-02 DIAGNOSIS — Z23 Encounter for immunization: Secondary | ICD-10-CM

## 2016-09-02 NOTE — Progress Notes (Signed)
Patient seen in office for Influenza Vaccination.   Tolerated IM administration well.   Immunization history updated.  

## 2016-10-27 ENCOUNTER — Telehealth: Payer: Self-pay | Admitting: Family Medicine

## 2016-10-27 NOTE — Telephone Encounter (Signed)
Pt calling, seeing Urologist tomorrow for prostatitis N41.9.  Needs [employer] ref.  Auth # [id number] approved for Dr Junious Silk 6 visits 10/28/16 - 04/26/17.

## 2016-10-28 DIAGNOSIS — R3911 Hesitancy of micturition: Secondary | ICD-10-CM | POA: Diagnosis not present

## 2016-10-28 DIAGNOSIS — R102 Pelvic and perineal pain: Secondary | ICD-10-CM | POA: Diagnosis not present

## 2016-10-28 DIAGNOSIS — R3912 Poor urinary stream: Secondary | ICD-10-CM | POA: Diagnosis not present

## 2017-01-25 DIAGNOSIS — Z125 Encounter for screening for malignant neoplasm of prostate: Secondary | ICD-10-CM | POA: Diagnosis not present

## 2017-04-18 DIAGNOSIS — D2261 Melanocytic nevi of right upper limb, including shoulder: Secondary | ICD-10-CM | POA: Diagnosis not present

## 2017-04-18 DIAGNOSIS — D225 Melanocytic nevi of trunk: Secondary | ICD-10-CM | POA: Diagnosis not present

## 2017-04-18 DIAGNOSIS — D2271 Melanocytic nevi of right lower limb, including hip: Secondary | ICD-10-CM | POA: Diagnosis not present

## 2017-04-18 DIAGNOSIS — L570 Actinic keratosis: Secondary | ICD-10-CM | POA: Diagnosis not present

## 2017-04-18 DIAGNOSIS — L814 Other melanin hyperpigmentation: Secondary | ICD-10-CM | POA: Diagnosis not present

## 2017-04-18 DIAGNOSIS — B078 Other viral warts: Secondary | ICD-10-CM | POA: Diagnosis not present

## 2017-04-18 DIAGNOSIS — L821 Other seborrheic keratosis: Secondary | ICD-10-CM | POA: Diagnosis not present

## 2017-08-09 ENCOUNTER — Ambulatory Visit (INDEPENDENT_AMBULATORY_CARE_PROVIDER_SITE_OTHER): Payer: Medicare HMO

## 2017-08-09 DIAGNOSIS — Z23 Encounter for immunization: Secondary | ICD-10-CM | POA: Diagnosis not present

## 2017-08-09 NOTE — Progress Notes (Signed)
Patient received flu vaccine in left deltoid. Patient tolerated well. 

## 2017-09-20 ENCOUNTER — Ambulatory Visit
Admission: RE | Admit: 2017-09-20 | Discharge: 2017-09-20 | Disposition: A | Discharge status: Home / Self Care | Payer: Medicare HMO | Source: Ambulatory Visit | Attending: Family Medicine | Admitting: Family Medicine

## 2017-09-20 ENCOUNTER — Ambulatory Visit: Payer: Medicare HMO | Admitting: Family Medicine

## 2017-09-20 ENCOUNTER — Encounter: Payer: Self-pay | Admitting: Family Medicine

## 2017-09-20 VITALS — BP 110/70 | HR 70 | Temp 97.7°F | Resp 16 | Ht 72.0 in | Wt 218.0 lb

## 2017-09-20 DIAGNOSIS — R0789 Other chest pain: Secondary | ICD-10-CM | POA: Diagnosis not present

## 2017-09-20 DIAGNOSIS — R079 Chest pain, unspecified: Secondary | ICD-10-CM

## 2017-09-20 NOTE — Progress Notes (Signed)
Subjective:    Patient ID: Howard Davenport, male    DOB: September 05, 1946, 71 y.o.   MRN: [id number]  HPI Reports 2 months of atypical left chest wall pain.  Pain is located just below his left nipple.  He describes it as a dull ache.  Pain also is located just below his left scapula.  Pain seems to radiate from below his scapula towards his nipple.  There are no exacerbating or alleviating factors.  He denies any dyspnea on exertion or shortness of breath or nausea or vomiting or diaphoresis. Past Medical History:  Diagnosis Date  . GERD (gastroesophageal reflux disease)   . Hiatal hernia   . Inguinal hernia    Past Surgical History:  Procedure Laterality Date  . HERNIA REPAIR     Current Outpatient Medications on File Prior to Visit  Medication Sig Dispense Refill  . ALFALFA PO Take 2 tablets by mouth 2 (two) times daily.     Marland Kitchen OVER THE COUNTER MEDICATION Take 1 capsule by mouth daily. Beta Sitosterol supplyment    . Probiotic Product (PROBIOTIC DAILY PO) Take 1 tablet by mouth daily.    . vitamin E 400 UNIT capsule Take 400 Units by mouth daily.     No current facility-administered medications on file prior to visit.    No Known Allergies Social History   Socioeconomic History  . Marital status: Married    Spouse name: Not on file  . Number of children: Not on file  . Years of education: Not on file  . Highest education level: Not on file  Social Needs  . Financial resource strain: Not on file  . Food insecurity - worry: Not on file  . Food insecurity - inability: Not on file  . Transportation needs - medical: Not on file  . Transportation needs - non-medical: Not on file  Occupational History  . Not on file  Tobacco Use  . Smoking status: Never Smoker  . Smokeless tobacco: Never Used  Substance and Sexual Activity  . Alcohol use: No  . Drug use: No  . Sexual activity: Yes  Other Topics Concern  . Not on file  Social History Narrative   Married with 2 children   Right handed   12 th    3 cups daily      Review of Systems  All other systems reviewed and are negative.      Objective:   Physical Exam  Cardiovascular: Normal rate, regular rhythm and normal heart sounds. Exam reveals no gallop and no friction rub.  No murmur heard. Pulmonary/Chest: Effort normal and breath sounds normal. No accessory muscle usage. No respiratory distress. He has no wheezes. He has no rales.     Chest wall is not dull to percussion. He exhibits no mass, no tenderness, no bony tenderness, no crepitus, no edema, no deformity, no swelling and no retraction. Left breast exhibits no inverted nipple, no mass, no nipple discharge, no skin change and no tenderness. Breasts are symmetrical.    Abdominal: Soft. Bowel sounds are normal. He exhibits no distension and no mass. There is no tenderness. There is no rebound and no guarding.  Vitals reviewed.         Assessment & Plan:  Chest pain, unspecified type - Plan: EKG 12-Lead, DG Chest 2 View EKG today shows normal sinus rhythm with normal intervals and a normal axis but nonspecific ST changes in the inferior leads which is chronic and present since 2015.  I will send the patient for a chest x-ray but I believe the pain is likely musculoskeletal.  If chest x-ray is normal, I will reassure the patient.  If abnormalities are seen on the chest x-ray, we may need to consider a CT scan of the chest.  If worsening, I would consider a thoracic spine MRI to evaluate for thoracic radiculopathy

## 2017-10-04 DIAGNOSIS — H2513 Age-related nuclear cataract, bilateral: Secondary | ICD-10-CM | POA: Diagnosis not present

## 2017-10-04 DIAGNOSIS — H52223 Regular astigmatism, bilateral: Secondary | ICD-10-CM | POA: Diagnosis not present

## 2017-10-04 DIAGNOSIS — H04203 Unspecified epiphora, bilateral lacrimal glands: Secondary | ICD-10-CM | POA: Diagnosis not present

## 2017-10-04 DIAGNOSIS — H524 Presbyopia: Secondary | ICD-10-CM | POA: Diagnosis not present

## 2017-10-04 DIAGNOSIS — H04123 Dry eye syndrome of bilateral lacrimal glands: Secondary | ICD-10-CM | POA: Diagnosis not present

## 2017-10-04 DIAGNOSIS — H5203 Hypermetropia, bilateral: Secondary | ICD-10-CM | POA: Diagnosis not present

## 2017-10-31 DIAGNOSIS — Z0100 Encounter for examination of eyes and vision without abnormal findings: Secondary | ICD-10-CM | POA: Diagnosis not present

## 2017-12-08 DIAGNOSIS — K219 Gastro-esophageal reflux disease without esophagitis: Secondary | ICD-10-CM | POA: Diagnosis not present

## 2017-12-08 DIAGNOSIS — Z86010 Personal history of colonic polyps: Secondary | ICD-10-CM | POA: Diagnosis not present

## 2017-12-08 DIAGNOSIS — Z1211 Encounter for screening for malignant neoplasm of colon: Secondary | ICD-10-CM | POA: Diagnosis not present

## 2018-01-16 DIAGNOSIS — K635 Polyp of colon: Secondary | ICD-10-CM | POA: Diagnosis not present

## 2018-01-16 DIAGNOSIS — K621 Rectal polyp: Secondary | ICD-10-CM | POA: Diagnosis not present

## 2018-01-16 DIAGNOSIS — D128 Benign neoplasm of rectum: Secondary | ICD-10-CM | POA: Diagnosis not present

## 2018-01-16 DIAGNOSIS — Z1211 Encounter for screening for malignant neoplasm of colon: Secondary | ICD-10-CM | POA: Diagnosis not present

## 2018-01-16 DIAGNOSIS — Z86010 Personal history of colonic polyps: Secondary | ICD-10-CM | POA: Diagnosis not present

## 2018-01-16 DIAGNOSIS — D125 Benign neoplasm of sigmoid colon: Secondary | ICD-10-CM | POA: Diagnosis not present

## 2018-01-20 ENCOUNTER — Encounter: Payer: Self-pay | Admitting: Family Medicine

## 2018-01-20 DIAGNOSIS — K635 Polyp of colon: Secondary | ICD-10-CM | POA: Insufficient documentation

## 2018-03-10 ENCOUNTER — Other Ambulatory Visit: Payer: Self-pay

## 2018-03-10 ENCOUNTER — Ambulatory Visit (INDEPENDENT_AMBULATORY_CARE_PROVIDER_SITE_OTHER): Payer: Medicare HMO | Admitting: Family Medicine

## 2018-03-10 ENCOUNTER — Encounter: Payer: Self-pay | Admitting: Family Medicine

## 2018-03-10 VITALS — BP 130/82 | HR 71 | Temp 98.2°F | Wt 213.2 lb

## 2018-03-10 DIAGNOSIS — K644 Residual hemorrhoidal skin tags: Secondary | ICD-10-CM | POA: Diagnosis not present

## 2018-03-10 MED ORDER — BENZOCAINE 20 % RE OINT: TOPICAL_OINTMENT | RECTAL | 1 refills | Status: DC | PRN

## 2018-03-10 MED ORDER — HYDROCORTISONE 2.5 % RE CREA: 1.0000 "application " | TOPICAL_CREAM | Freq: Two times a day (BID) | RECTAL | 0 refills | Status: DC

## 2018-03-10 NOTE — Patient Instructions (Addendum)
Add fiber to diet with fibercon, metamucil, benefiber, citrucel or similar Continue to drink plenty of fluids You can continue over the counter preperation H cream or ointment.   Use steroids 2 times a day, and stop using as soon as you feel decreased size and less irritation  Can use topical numbing medication as prescribed  Do sitz baths  Reduce times that you sit for long periods of time, particularly on the toilet.   If it does not improve in 7-10 days, or if pain severely worsens, there are other medications to try, or we can help get you to someone to do the procedure.   Hemorrhoids   Hemorrhoids are swollen veins in and around the rectum or anus. There are two types of hemorrhoids:  Internal hemorrhoids. These occur in the veins that are just inside the rectum. They may poke through to the outside and become irritated and painful.  External hemorrhoids. These occur in the veins that are outside of the anus and can be felt as a painful swelling or hard lump near the anus.  Most hemorrhoids do not cause serious problems, and they can be managed with home treatments such as diet and lifestyle changes. If home treatments do not help your symptoms, procedures can be done to shrink or remove the hemorrhoids. What are the causes? This condition is caused by increased pressure in the anal area. This pressure may result from various things, including:  Constipation.  Straining to have a bowel movement.  Diarrhea.  Pregnancy.  Obesity.  Sitting for long periods of time.  Heavy lifting or other activity that causes you to strain.  Anal sex.  What are the signs or symptoms? Symptoms of this condition include:  Pain.  Anal itching or irritation.  Rectal bleeding.  Leakage of stool (feces).  Anal swelling.  One or more lumps around the anus.  How is this diagnosed? This condition can often be diagnosed through a visual exam. Other exams or tests may also be  done, such as:  Examination of the rectal area with a gloved hand (digital rectal exam).  Examination of the anal canal using a small tube (anoscope).  A blood test, if you have lost a significant amount of blood.  A test to look inside the colon (sigmoidoscopy or colonoscopy).  How is this treated? This condition can usually be treated at home. However, various procedures may be done if dietary changes, lifestyle changes, and other home treatments do not help your symptoms. These procedures can help make the hemorrhoids smaller or remove them completely. Some of these procedures involve surgery, and others do not. Common procedures include:  Rubber band ligation. Rubber bands are placed at the base of the hemorrhoids to cut off the blood supply to them.  Sclerotherapy. Medicine is injected into the hemorrhoids to shrink them.  Infrared coagulation. A type of light energy is used to get rid of the hemorrhoids.  Hemorrhoidectomy surgery. The hemorrhoids are surgically removed, and the veins that supply them are tied off.  Stapled hemorrhoidopexy surgery. A circular stapling device is used to remove the hemorrhoids and use staples to cut off the blood supply to them.  Follow these instructions at home: Eating and drinking  Eat foods that have a lot of fiber in them, such as whole grains, beans, nuts, fruits, and vegetables. Ask your health care provider about taking products that have added fiber (fiber supplements).  Drink enough fluid to keep your urine clear or pale yellow.  Managing pain and swelling  Take warm sitz baths for 20 minutes, 3-4 times a day to ease pain and discomfort.  If directed, apply ice to the affected area. Using ice packs between sitz baths may be helpful. ? Put ice in a plastic bag. ? Place a towel between your skin and the bag. ? Leave the ice on for 20 minutes, 2-3 times a day. General instructions  Take over-the-counter and prescription medicines only  as told by your health care provider.  Use medicated creams or suppositories as told.  Exercise regularly.  Go to the bathroom when you have the urge to have a bowel movement. Do not wait.  Avoid straining to have bowel movements.  Keep the anal area dry and clean. Use wet toilet paper or moist towelettes after a bowel movement.  Do not sit on the toilet for long periods of time. This increases blood pooling and pain. Contact a health care provider if:  You have increasing pain and swelling that are not controlled by treatment or medicine.  You have uncontrolled bleeding.  You have difficulty having a bowel movement, or you are unable to have a bowel movement.  You have pain or inflammation outside the area of the hemorrhoids. This information is not intended to replace advice given to you by your health care provider. Make sure you discuss any questions you have with your health care provider. Document Released: 10/29/2000 Document Revised: 03/31/2016 Document Reviewed: 07/16/2015 Elsevier Interactive Patient Education  2018 [employer].   How to Take a [employer] A sitz bath is a warm water bath that is taken while you are sitting down. The water should only come up to your hips and should cover your buttocks. Your health care provider may recommend a sitz bath to help you:  Clean the lower part of your body, including your genital area.  With itching.  With pain.  With sore muscles or muscles that tighten or spasm.  How to take a sitz bath Take 3-4 sitz baths per day or as told by your health care provider. 1. Partially fill a bathtub with warm water. You will only need the water to be deep enough to cover your hips and buttocks when you are sitting in it. 2. If your health care provider told you to put medicine in the water, follow the directions exactly. 3. Sit in the water and open the tub drain a little. 4. Turn on the warm water again to keep the tub at the correct  level. Keep the water running constantly. 5. Soak in the water for 15-20 minutes or as told by your health care provider. 6. After the sitz bath, pat the affected area dry first. Do not rub it. 7. Be careful when you stand up after the sitz bath because you may feel dizzy.  Contact a health care provider if:  Your symptoms get worse. Do not continue with sitz baths if your symptoms get worse.  You have new symptoms. Do not continue with sitz baths until you talk with your health care provider. This information is not intended to replace advice given to you by your health care provider. Make sure you discuss any questions you have with your health care provider. Document Released: 07/24/2004 Document Revised: 03/31/2016 Document Reviewed: 10/30/2014 Elsevier Interactive Patient Education  [employer].

## 2018-03-10 NOTE — Progress Notes (Signed)
Patient ID: Howard Davenport, male    DOB: Sep 21, 1946, 72 y.o.   MRN: [id number]  PCP: Susy Frizzle, MD  Chief Complaint  Patient presents with  . Hemorrhoids    Has tried preparation H, and suppository    Subjective:   Howard Davenport is a 72 y.o. male, presents to clinic with CC of 3 days of irritating, mildly painful, hemorrhoid which she states is about the size of a walnut.  He has tried some old hemorrhoid cream that he states has been in his bathroom for 20 years, so he is not sure of the ingredients he also try rectal suppository which he did say it was for hemorrhoids on it.  He denies any bright red blood per rectum were noticed in the toilet or on his stool, no melena or hematochezia.  He denies any abdominal pain, nausea, vomiting he denies any constipation.  States his stools have been soft and formed and he has been increasing his fluid intake.  States that he may have developed hemorrhoids because he likes to sit on the commode for long periods of time reading westerns.   No other treatments tried at home.  He has had a few small hemorrhoid in the past that resolved on their own.   No other associated symptoms, no other alleviating or aggravating factors. No fever, chills, sweats.   Patient Active Problem List   Diagnosis Date Noted  . Colon polyp   . Vasovagal syncope 02/12/2014  . Near syncope 01/30/2014  . GERD (gastroesophageal reflux disease)   . PVD 12/16/2009  . PALPITATIONS 12/12/2009  . CHEST PAIN 12/12/2009     Prior to Admission medications   Medication Sig Start Date End Date Taking? Authorizing Provider  ALFALFA PO Take 2 tablets by mouth 2 (two) times daily.    Yes [provider]  OVER THE COUNTER MEDICATION Take 1 capsule by mouth daily. Beta Sitosterol supplyment   Yes [provider]  Probiotic Product (PROBIOTIC DAILY PO) Take 1 tablet by mouth daily.   Yes [provider]  vitamin E 400 UNIT capsule Take 400 Units by  mouth daily.   Yes [provider]  benzocaine (AMERICAINE) 20 % rectal ointment Place rectally every 4 (four) hours as needed for pain. 03/10/18   Delsa Grana, PA-C  hydrocortisone (ANUSOL-HC) 2.5 % rectal cream Place 1 application rectally 2 (two) times daily. 03/10/18   Delsa Grana, PA-C     No Known Allergies   Family History  Problem Relation Age of Onset  . Leukemia Mother   . Heart disease Father      Social History   Socioeconomic History  . Marital status: Married    Spouse name: Not on file  . Number of children: Not on file  . Years of education: Not on file  . Highest education level: Not on file  Occupational History  . Not on file  Social Needs  . Financial resource strain: Not on file  . Food insecurity:    Worry: Not on file    Inability: Not on file  . Transportation needs:    Medical: Not on file    Non-medical: Not on file  Tobacco Use  . Smoking status: Never Smoker  . Smokeless tobacco: Never Used  Substance and Sexual Activity  . Alcohol use: No  . Drug use: No  . Sexual activity: Yes  Lifestyle  . Physical activity:    Days per week: Not  on file    Minutes per session: Not on file  . Stress: Not on file  Relationships  . Social connections:    Talks on phone: Not on file    Gets together: Not on file    Attends religious service: Not on file    Active member of club or organization: Not on file    Attends meetings of clubs or organizations: Not on file    Relationship status: Not on file  . Intimate partner violence:    Fear of current or ex partner: Not on file    Emotionally abused: Not on file    Physically abused: Not on file    Forced sexual activity: Not on file  Other Topics Concern  . Not on file  Social History Narrative   Married with 2 children   Right handed   12 th    3 cups daily     Review of Systems  Constitutional: Negative.  Negative for activity change, appetite change, chills, diaphoresis,  fatigue and fever.  HENT: Negative.   Eyes: Negative.   Respiratory: Negative.   Cardiovascular: Negative.   Gastrointestinal: Positive for rectal pain. Negative for abdominal distention, abdominal pain, anal bleeding, blood in stool, constipation, diarrhea, nausea and vomiting.  Endocrine: Negative.   Genitourinary: Negative.  Negative for difficulty urinating, enuresis, frequency and urgency.  Musculoskeletal: Negative.  Negative for back pain.  Skin: Negative.   Neurological: Negative.   Psychiatric/Behavioral: Negative.   All other systems reviewed and are negative.      Objective:    Vitals:   03/10/18 1448  BP: 130/82  Pulse: 71  Temp: 98.2 F (36.8 C)  TempSrc: Oral  SpO2: 97%  Weight: 213 lb 4 oz (96.7 kg)      Physical Exam  Constitutional: He is oriented to person, place, and time. He appears well-developed and well-nourished.  Non-toxic appearance. He does not appear ill. No distress.  Well-appearing male, appears stated age, no distress  HENT:  Head: Normocephalic and atraumatic.  Right Ear: Tympanic membrane, external ear and ear canal normal.  Left Ear: Tympanic membrane, external ear and ear canal normal.  Nose: Nose normal. No mucosal edema or rhinorrhea. Right sinus exhibits no maxillary sinus tenderness and no frontal sinus tenderness. Left sinus exhibits no maxillary sinus tenderness and no frontal sinus tenderness.  Mouth/Throat: Uvula is midline. No trismus in the jaw. No uvula swelling. No posterior oropharyngeal edema or posterior oropharyngeal erythema.  MMM  Eyes: Pupils are equal, round, and reactive to light. Conjunctivae, EOM and lids are normal.  Neck: Trachea normal, normal range of motion and phonation normal. Neck supple. No tracheal deviation present.  Cardiovascular: Normal rate, regular rhythm and normal pulses.  Pulmonary/Chest: Effort normal. No respiratory distress. He has no rhonchi.  Abdominal: Soft. Normal appearance and bowel  sounds are normal. He exhibits no distension. There is no tenderness. There is no rebound and no guarding.  Genitourinary: Rectal exam shows external hemorrhoid. Rectal exam shows no internal hemorrhoid, no fissure, no mass, no tenderness and anal tone normal.  Genitourinary Comments: Large external hemorrhoid roughly 2 x 1 cm, flesh-colored, nontender to palpation  Musculoskeletal: Normal range of motion. He exhibits no edema.  Neurological: He is alert and oriented to person, place, and time. No sensory deficit. He exhibits normal muscle tone. Coordination and gait normal.  Skin: Skin is warm, dry and intact. No rash noted. He is not diaphoretic. No cyanosis or erythema. Nails show no clubbing.  Psychiatric: He has a normal mood and affect. His speech is normal and behavior is normal.  Nursing note and vitals reviewed.         Assessment & Plan:      ICD-10-CM   1. External hemorrhoid K64.4 hydrocortisone (ANUSOL-HC) 2.5 % rectal cream    benzocaine (AMERICAINE) 20 % rectal ointment    72 year old male with large external hemorrhoid, does not appear thrombosed based on the color of the hemorrhoid, was not tender on exam, rectal tone was normal, did not feel any internal hemorrhoids or anal fissures.  His pain is mild and has had a symptoms for 3 days, he did express that he would like to have it lanced open or aspirated however I discussed with the patient that given the mildness of his pain and the short time of symptoms it may resolve, and procedure would likely be more indicated with closer to 7 to 10 days of severe pain or if it appears thrombosed.  Prescribed steroid and numbing topical cream and ointment.  Told him to continue Preparation H but to buy new ointment and to only have vasoconstrictive active ingredient and not any additional steroids.  He is to stop sitting for prolonged periods of time particularly on the toilet.  Also continue to drink plenty of fluids and add fiber to  diet.  He does not improve there are other topical treatments we can try next week, and if it worsens I have discussed with him that we can attempt procedure here or can refer him to a surgeon.  Instructions for the plan were reviewed with him, printed out and highlighted, he agrees to plan.  He left in good condition.  Delsa Grana, PA-C 03/10/18 4:43 PM

## 2018-03-27 DIAGNOSIS — M6208 Separation of muscle (nontraumatic), other site: Secondary | ICD-10-CM | POA: Diagnosis not present

## 2018-03-27 DIAGNOSIS — K429 Umbilical hernia without obstruction or gangrene: Secondary | ICD-10-CM | POA: Diagnosis not present

## 2018-03-27 DIAGNOSIS — K645 Perianal venous thrombosis: Secondary | ICD-10-CM | POA: Diagnosis not present

## 2018-03-27 DIAGNOSIS — K641 Second degree hemorrhoids: Secondary | ICD-10-CM | POA: Diagnosis not present

## 2018-03-29 ENCOUNTER — Telehealth: Payer: Self-pay | Admitting: Family Medicine

## 2018-03-29 DIAGNOSIS — H90A31 Mixed conductive and sensorineural hearing loss, unilateral, right ear with restricted hearing on the contralateral side: Secondary | ICD-10-CM | POA: Diagnosis not present

## 2018-03-29 DIAGNOSIS — H902 Conductive hearing loss, unspecified: Secondary | ICD-10-CM | POA: Diagnosis not present

## 2018-03-29 DIAGNOSIS — H9190 Unspecified hearing loss, unspecified ear: Secondary | ICD-10-CM

## 2018-03-29 NOTE — Telephone Encounter (Signed)
Patient called in asking if he could get a referral to Dr. Inda Merlin at [facility] for a hearing loss. Patient states that there is a procedure that this provider does that can restore hearing. Please advise?

## 2018-03-30 NOTE — Telephone Encounter (Signed)
I am ok with referral 

## 2018-03-31 NOTE — Telephone Encounter (Signed)
Spoke with patient and informed him that his referral has been placed. Patient verbalized understanding.

## 2018-04-21 DIAGNOSIS — L570 Actinic keratosis: Secondary | ICD-10-CM | POA: Diagnosis not present

## 2018-04-21 DIAGNOSIS — L821 Other seborrheic keratosis: Secondary | ICD-10-CM | POA: Diagnosis not present

## 2018-04-27 DIAGNOSIS — H902 Conductive hearing loss, unspecified: Secondary | ICD-10-CM | POA: Diagnosis not present

## 2018-04-27 DIAGNOSIS — H8091 Unspecified otosclerosis, right ear: Secondary | ICD-10-CM | POA: Diagnosis not present

## 2018-04-27 DIAGNOSIS — H9193 Unspecified hearing loss, bilateral: Secondary | ICD-10-CM | POA: Diagnosis not present

## 2018-04-27 DIAGNOSIS — Z79899 Other long term (current) drug therapy: Secondary | ICD-10-CM | POA: Diagnosis not present

## 2018-05-04 DIAGNOSIS — H9191 Unspecified hearing loss, right ear: Secondary | ICD-10-CM | POA: Diagnosis not present

## 2018-05-04 DIAGNOSIS — H902 Conductive hearing loss, unspecified: Secondary | ICD-10-CM | POA: Diagnosis not present

## 2018-05-04 DIAGNOSIS — H8081 Other otosclerosis, right ear: Secondary | ICD-10-CM | POA: Diagnosis not present

## 2018-05-31 DIAGNOSIS — N401 Enlarged prostate with lower urinary tract symptoms: Secondary | ICD-10-CM | POA: Diagnosis not present

## 2018-05-31 DIAGNOSIS — R3912 Poor urinary stream: Secondary | ICD-10-CM | POA: Diagnosis not present

## 2018-05-31 DIAGNOSIS — N5201 Erectile dysfunction due to arterial insufficiency: Secondary | ICD-10-CM | POA: Diagnosis not present

## 2018-06-07 ENCOUNTER — Telehealth: Payer: Self-pay | Admitting: Family Medicine

## 2018-06-08 NOTE — Telephone Encounter (Signed)
Pt called yesterday and LMOVM for me to call him back about the pain in his left side. Called pt LMOVM to return call.

## 2018-06-09 NOTE — Telephone Encounter (Signed)
Per Dr. Dennard Schaumann will NTBS as it was 09/2017 when we seen him last for this problem. Called and informed pt of recommendation and he will call back to schedule apt.

## 2018-06-29 ENCOUNTER — Encounter: Payer: Self-pay | Admitting: Family Medicine

## 2018-06-29 ENCOUNTER — Ambulatory Visit (INDEPENDENT_AMBULATORY_CARE_PROVIDER_SITE_OTHER): Payer: Medicare HMO | Admitting: Family Medicine

## 2018-06-29 VITALS — BP 110/74 | HR 56 | Temp 97.6°F | Resp 14 | Ht 72.0 in | Wt 214.0 lb

## 2018-06-29 DIAGNOSIS — R079 Chest pain, unspecified: Secondary | ICD-10-CM | POA: Diagnosis not present

## 2018-06-29 DIAGNOSIS — R51 Headache: Secondary | ICD-10-CM | POA: Diagnosis not present

## 2018-06-29 DIAGNOSIS — R0789 Other chest pain: Secondary | ICD-10-CM

## 2018-06-29 DIAGNOSIS — R519 Headache, unspecified: Secondary | ICD-10-CM

## 2018-06-29 NOTE — Progress Notes (Signed)
Subjective:    Patient ID: Howard Davenport, male    DOB: 1946-02-17, 72 y.o.   MRN: [id number]  HPI  09/2017 Reports 2 months of atypical left chest wall pain.  Pain is located just below his left nipple.  He describes it as a dull ache.  Pain also is located just below his left scapula.  Pain seems to radiate from below his scapula towards his nipple.  There are no exacerbating or alleviating factors.  He denies any dyspnea on exertion or shortness of breath or nausea or vomiting or diaphoresis.  At that time, my plan was: EKG today shows normal sinus rhythm with normal intervals and a normal axis but nonspecific ST changes in the inferior leads which is chronic and present since 2015.  I will send the patient for a chest x-ray but I believe the pain is likely musculoskeletal.  If chest x-ray is normal, I will reassure the patient.  If abnormalities are seen on the chest x-ray, we may need to consider a CT scan of the chest.  If worsening, I would consider a thoracic spine MRI to evaluate for thoracic radiculopathy.  06/29/18 Chest x-ray was completely clear with no visible abnormalities.  My advice the patient at that time was to allow some tincture of time to see if the pain will gradually improve and to return for a possible CT scan of the chest that the pain worsened or persisted.  Patient has not been seen since November 2018.  He is here today again reporting chest pain.  He states that he has had the chest pain in the left chest constantly for the last 9 months.  Some days are worse than others.  He denies any weight loss.  He does have night sweats.  He denies any fevers.  He denies any cough.  He denies any hemoptysis.  However the pain seems to be intensifying and given the fact it has been present now for more than 9 months, he believes a further work-up must be undertaken.  He also reports a daily pressure-like headache all throughout his head in no specific location for the last week.  He  denies any vision changes.  He denies any dizziness.  He denies any head trauma.  He denies any memory loss.  He denies any nausea or vomiting.  He denies any photophobia or phonophobia.   Past Medical History:  Diagnosis Date  . Colon polyp    tubular adenoma 01/2018  . GERD (gastroesophageal reflux disease)   . Hiatal hernia   . Inguinal hernia    Past Surgical History:  Procedure Laterality Date  . HERNIA REPAIR     Current Outpatient Medications on File Prior to Visit  Medication Sig Dispense Refill  . ALFALFA PO Take 2 tablets by mouth 2 (two) times daily.     . benzocaine (AMERICAINE) 20 % rectal ointment Place rectally every 4 (four) hours as needed for pain. 28.4 g 1  . hydrocortisone (ANUSOL-HC) 2.5 % rectal cream Place 1 application rectally 2 (two) times daily. 30 g 0  . OVER THE COUNTER MEDICATION Take 1 capsule by mouth daily. Beta Sitosterol supplyment    . Probiotic Product (PROBIOTIC DAILY PO) Take 1 tablet by mouth daily.    . vitamin E 400 UNIT capsule Take 400 Units by mouth daily.     No current facility-administered medications on file prior to visit.    No Known Allergies Social History   Socioeconomic History  .  Marital status: Married    Spouse name: Not on file  . Number of children: Not on file  . Years of education: Not on file  . Highest education level: Not on file  Occupational History  . Not on file  Social Needs  . Financial resource strain: Not on file  . Food insecurity:    Worry: Not on file    Inability: Not on file  . Transportation needs:    Medical: Not on file    Non-medical: Not on file  Tobacco Use  . Smoking status: Never Smoker  . Smokeless tobacco: Never Used  Substance and Sexual Activity  . Alcohol use: No  . Drug use: No  . Sexual activity: Yes  Lifestyle  . Physical activity:    Days per week: Not on file    Minutes per session: Not on file  . Stress: Not on file  Relationships  . Social connections:    Talks  on phone: Not on file    Gets together: Not on file    Attends religious service: Not on file    Active member of club or organization: Not on file    Attends meetings of clubs or organizations: Not on file    Relationship status: Not on file  . Intimate partner violence:    Fear of current or ex partner: Not on file    Emotionally abused: Not on file    Physically abused: Not on file    Forced sexual activity: Not on file  Other Topics Concern  . Not on file  Social History Narrative   Married with 2 children   Right handed   12 th    3 cups daily      Review of Systems  All other systems reviewed and are negative.      Objective:   Physical Exam  Cardiovascular: Normal rate, regular rhythm and normal heart sounds. Exam reveals no gallop and no friction rub.  No murmur heard. Pulmonary/Chest: Effort normal and breath sounds normal. No accessory muscle usage. No respiratory distress. He has no wheezes. He has no rales. Chest wall is not dull to percussion. He exhibits no mass, no tenderness, no bony tenderness, no crepitus, no edema, no deformity, no swelling and no retraction. Left breast exhibits no inverted nipple, no mass, no nipple discharge, no skin change and no tenderness. Breasts are symmetrical.    Abdominal: Soft. Bowel sounds are normal. He exhibits no distension and no mass. There is no tenderness. There is no rebound and no guarding.  Vitals reviewed.         Assessment & Plan:  Daily headache - Plan: Sedimentation rate, CBC with Differential/Platelet, BASIC METABOLIC PANEL WITH GFR  Chest pain, unspecified type  Other chest pain - Plan: CT Chest W Contrast  Pain has persisted for 9 months.  It seems to be chest wall related.  He states that there is no association to food.  He denies any heartburn.  He denies any angina.  He denies any shortness of breath.  However he is starting to have some night sweats.  His mother had lymphoma and he is concerned  that maybe this could be related to lymphoma or some type of cancer in the chest.  He previously had a chest x-ray that was unremarkable.  Work-up to date has been unrevealing.  I will proceed with a CT scan of the chest to evaluate further.  I believe this is most likely musculoskeletal  however conservative management has failed now for 9 months.  Regarding his daily headache, I will check a CBC, sedimentation rate.  Most likely tension headache.  If persistent, consider Topamax

## 2018-06-30 LAB — BASIC METABOLIC PANEL WITH GFR
BUN: 20 mg/dL (ref 7–25)
CO2: 28 mmol/L (ref 20–32)
CREATININE: 1.04 mg/dL (ref 0.70–1.18)
Calcium: 8.7 mg/dL (ref 8.6–10.3)
Chloride: 103 mmol/L (ref 98–110)
GFR, EST NON AFRICAN AMERICAN: 72 mL/min/{1.73_m2} (ref >=60)
GFR, Est African American: 83 mL/min/{1.73_m2} (ref >=60)
Glucose, Bld: 118 mg/dL — ABNORMAL HIGH (ref 65–99)
Potassium: 4.3 mmol/L (ref 3.5–5.3)
SODIUM: 139 mmol/L (ref 135–146)

## 2018-06-30 LAB — CBC WITH DIFFERENTIAL/PLATELET
BASOS ABS: 51 {cells}/uL (ref 0–200)
Basophils Relative: 0.8 %
EOS ABS: 141 {cells}/uL (ref 15–500)
Eosinophils Relative: 2.2 %
HEMATOCRIT: 42.9 % (ref 38.5–50.0)
HEMOGLOBIN: 14.5 g/dL (ref 13.2–17.1)
LYMPHS ABS: 2509 {cells}/uL (ref 850–3900)
MCH: 31 pg (ref 27.0–33.0)
MCHC: 33.8 g/dL (ref 32.0–36.0)
MCV: 91.7 fL (ref 80.0–100.0)
MPV: 10.3 fL (ref 7.5–12.5)
Monocytes Relative: 8.2 %
NEUTROS ABS: 3174 {cells}/uL (ref 1500–7800)
NEUTROS PCT: 49.6 %
Platelets: 182 10*3/uL (ref 140–400)
RBC: 4.68 10*6/uL (ref 4.20–5.80)
RDW: 13.1 % (ref 11.0–15.0)
Total Lymphocyte: 39.2 %
WBC mixed population: 525 cells/uL (ref 200–950)
WBC: 6.4 10*3/uL (ref 3.8–10.8)

## 2018-06-30 LAB — SEDIMENTATION RATE: SED RATE: 6 mm/h (ref 0–20)

## 2018-07-14 ENCOUNTER — Ambulatory Visit
Admission: RE | Admit: 2018-07-14 | Discharge: 2018-07-14 | Disposition: A | Discharge status: Home / Self Care | Payer: Medicare HMO | Source: Ambulatory Visit | Attending: Family Medicine | Admitting: Family Medicine

## 2018-07-14 DIAGNOSIS — R0789 Other chest pain: Secondary | ICD-10-CM

## 2018-07-14 IMAGING — CT CT CHEST W/ CM
2 of 4 series · 14 of 36 positions shown, 17 images · IV contrast (iopamidol)
Comparison: PA and lateral chest [DATE].

CLINICAL DATA: Anterior chest wall pain on the left for 8 months.
No known injury.

EXAM:
CT CHEST WITH CONTRAST
TECHNIQUE: Multidetector CT imaging of the chest was performed during
intravenous contrast administration.
CONTRAST:  75 mL [GQ] IOPAMIDOL ([GQ]) INJECTION 61%

[Series 2: chest 2.00 br40 s3 ax · axial · 0.67mm/px · z∈[+1707,+2015]mm · 11 of 184 slices shown, 14 images]
[im 15/184  mediastinal]
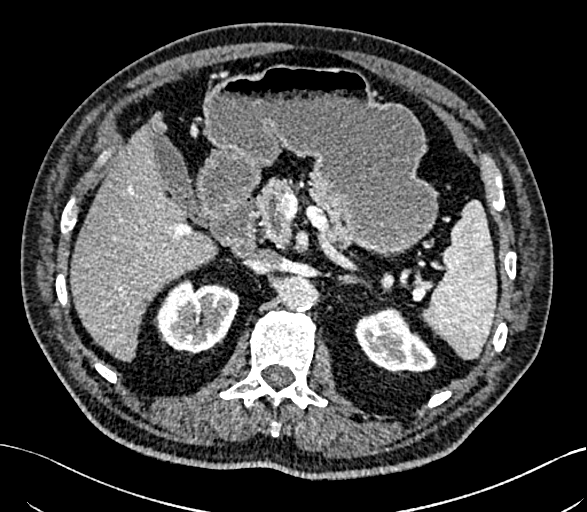
[im 15/184  lung]
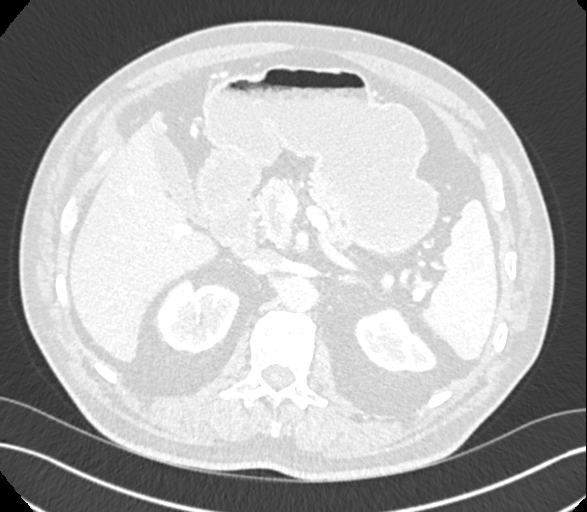
[im 29/184  lung]
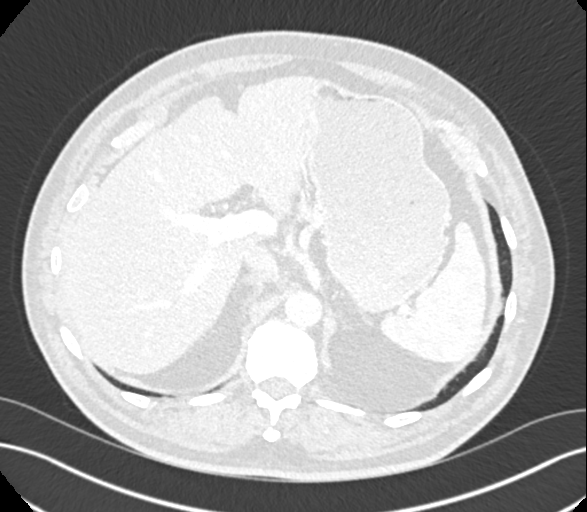
[im 43/184  lung]
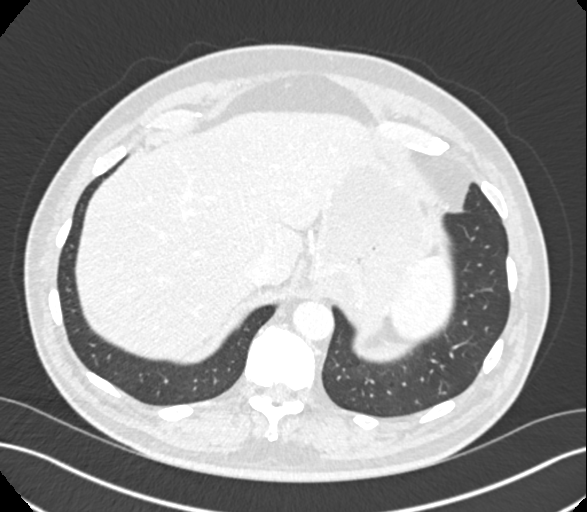
[im 57/184  lung]
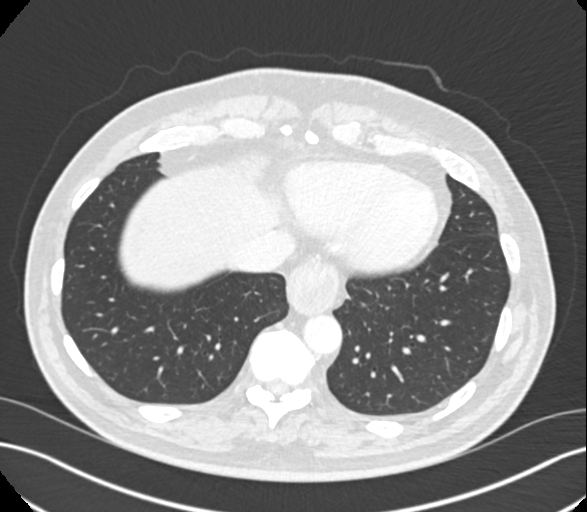
[im 71/184  mediastinal]
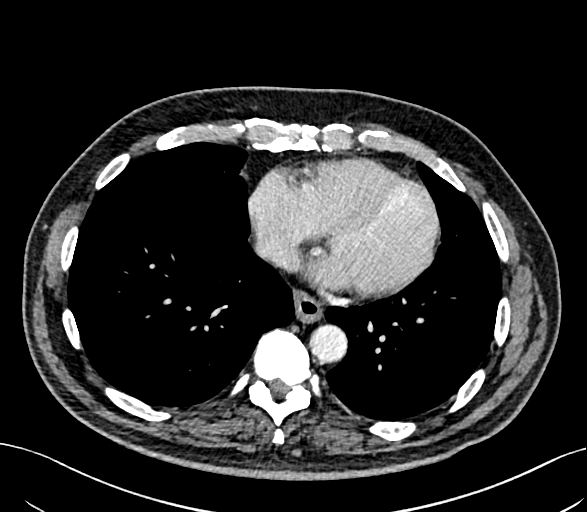
[im 71/184  lung]
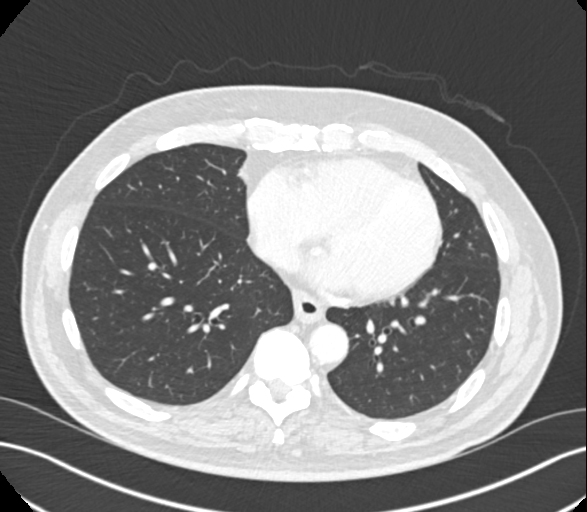
[im 99/184  lung]
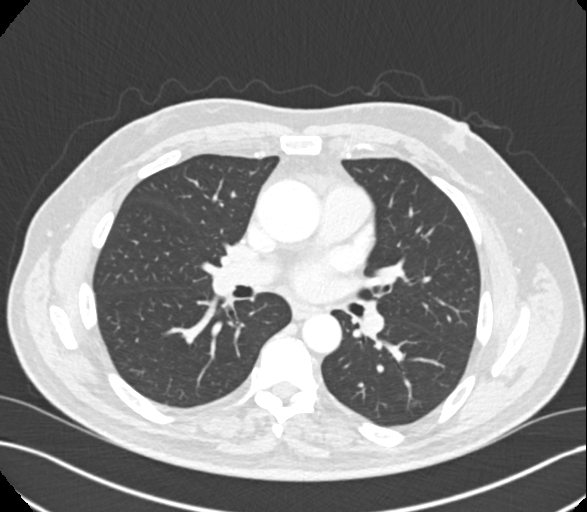
[im 113/184  lung]
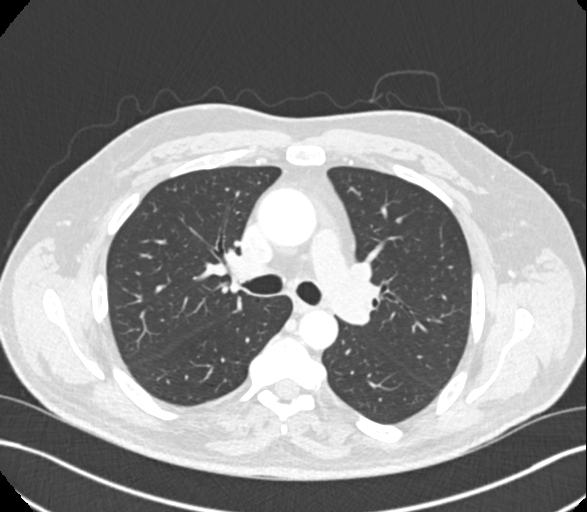
[im 127/184  lung]
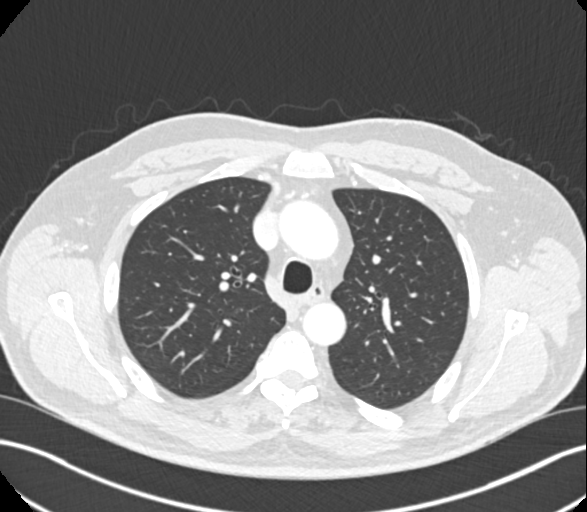
[im 141/184  mediastinal]
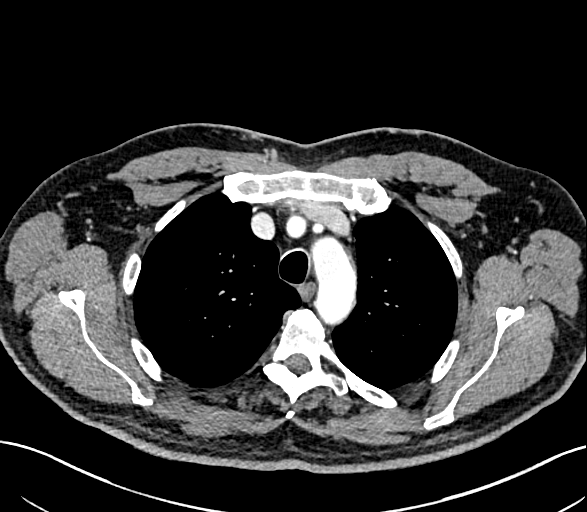
[im 141/184  lung]
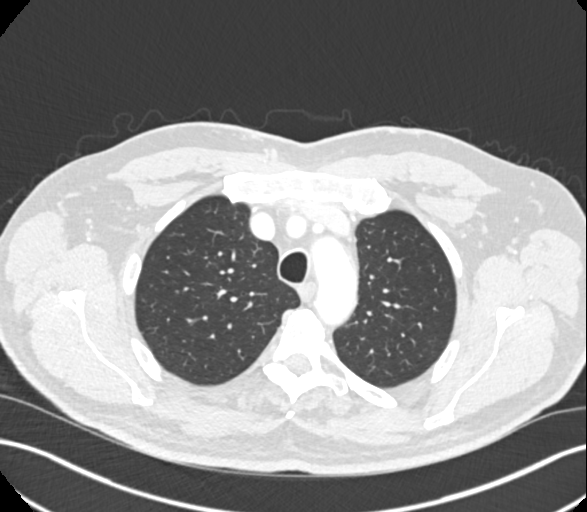
[im 155/184  lung]
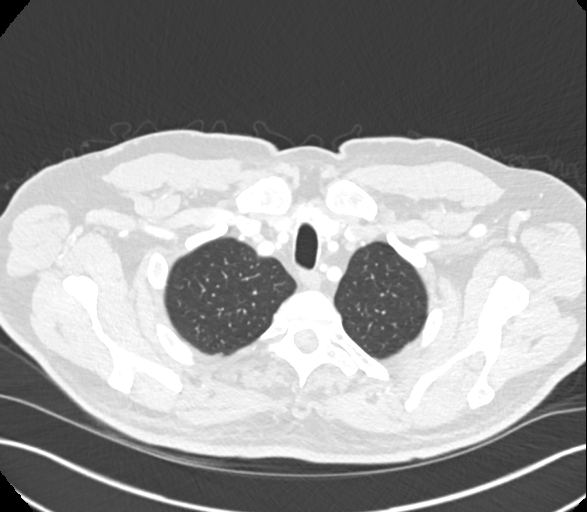
[im 169/184  lung]
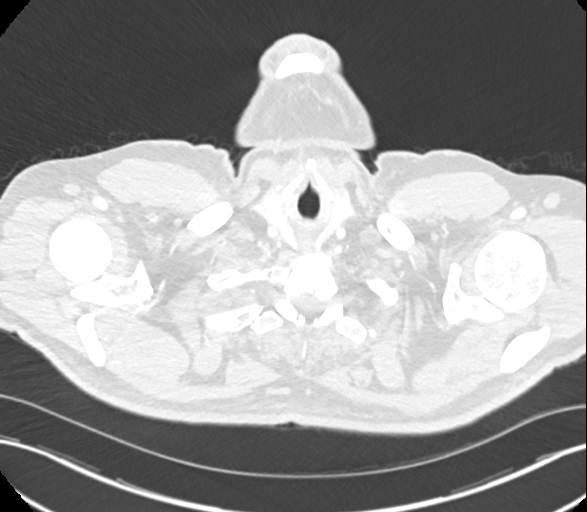

[Series 4: chest 2.00 br40 s3 cor · coronal · 0.72mm/px · 3 of 171 slices shown]
[im 35/171  lung]
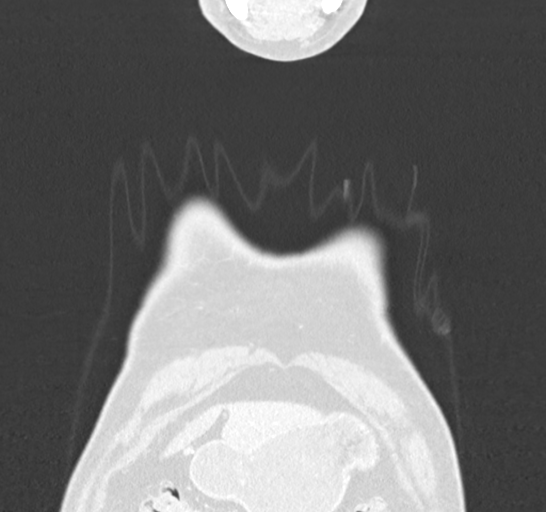
[im 69/171  lung]
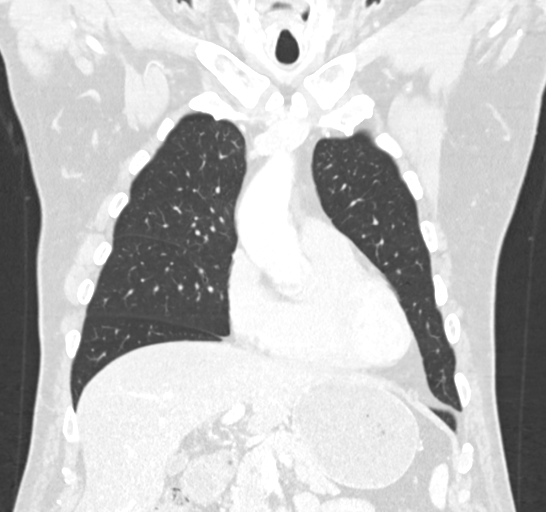
[im 103/171  lung]
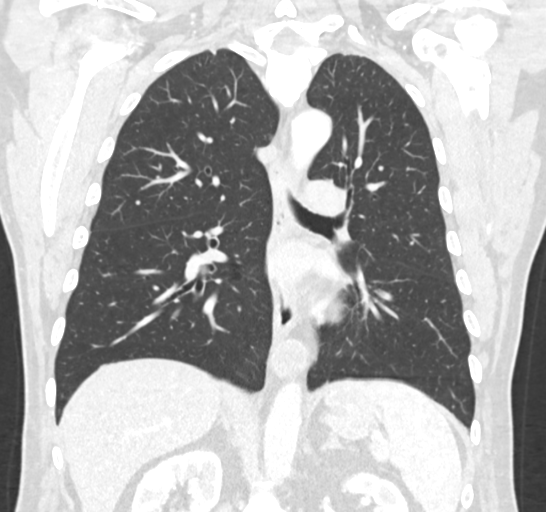

[14 of 36 positions shown; findings below may reference images not displayed]

FINDINGS: Cardiovascular: Heart size is normal. The ascending thoracic aorta
measures 4 cm in diameter. No aortic dissection is seen. There is
minimal aortic atherosclerosis.

Mediastinum/Nodes: No enlarged mediastinal, hilar, or axillary lymph
nodes. Thyroid gland, trachea, and esophagus demonstrate no
significant findings. Small calcified right hilar lymph node is
noted. Small hiatal hernia is identified.

Lungs/Pleura: Lungs are clear. No pleural effusion or pneumothorax.

Upper Abdomen: Fatty infiltration of the liver is noted. Otherwise
negative.

Musculoskeletal: No acute or focal abnormality. Chest wall
structures appear normal.
IMPRESSION: No acute disease.  No finding to explain the patient's symptoms.

Mild dilatation of the ascending thoracic aorta at 4 cm. Recommend
annual imaging followup by CTA or MRA. This recommendation follows
[GQ] ACCF/AHA/AATS/ACR/ASA/SCA/GODINJE/GODINJE/GODINJE/GODINJE Guidelines for the
Diagnosis and Management of Patients with Thoracic Aortic Disease.
Circulation. [GQ]; 121: [id number]

Mild aortic atherosclerosis ([GQ]-[GQ]).

## 2018-07-14 MED ORDER — IOPAMIDOL (ISOVUE-300) INJECTION 61%
75.0000 mL | Freq: Once | INTRAVENOUS | Status: AC | PRN
  Administered 2018-07-14: 75 mL via INTRAVENOUS

## 2018-07-18 ENCOUNTER — Encounter: Payer: Self-pay | Admitting: Family Medicine

## 2018-07-18 DIAGNOSIS — I7121 Aneurysm of the ascending aorta, without rupture: Secondary | ICD-10-CM | POA: Insufficient documentation

## 2018-07-18 DIAGNOSIS — I712 Thoracic aortic aneurysm, without rupture: Secondary | ICD-10-CM | POA: Insufficient documentation

## 2018-07-19 ENCOUNTER — Other Ambulatory Visit: Payer: Self-pay | Admitting: Family Medicine

## 2018-07-19 DIAGNOSIS — I729 Aneurysm of unspecified site: Secondary | ICD-10-CM

## 2018-07-19 NOTE — Progress Notes (Signed)
CT chest

## 2018-08-04 DIAGNOSIS — K219 Gastro-esophageal reflux disease without esophagitis: Secondary | ICD-10-CM | POA: Diagnosis not present

## 2018-08-04 DIAGNOSIS — H8091 Unspecified otosclerosis, right ear: Secondary | ICD-10-CM | POA: Diagnosis not present

## 2018-08-04 DIAGNOSIS — H902 Conductive hearing loss, unspecified: Secondary | ICD-10-CM | POA: Diagnosis not present

## 2018-08-04 DIAGNOSIS — H9071 Mixed conductive and sensorineural hearing loss, unilateral, right ear, with unrestricted hearing on the contralateral side: Secondary | ICD-10-CM | POA: Diagnosis not present

## 2018-08-25 ENCOUNTER — Ambulatory Visit (INDEPENDENT_AMBULATORY_CARE_PROVIDER_SITE_OTHER): Payer: Medicare HMO

## 2018-08-25 DIAGNOSIS — Z23 Encounter for immunization: Secondary | ICD-10-CM

## 2018-08-25 NOTE — Progress Notes (Signed)
Patient was in office for high dose flu vaccine.Patient received vaccine in his left deltoid.Patient tolerated well  

## 2018-10-06 DIAGNOSIS — H04203 Unspecified epiphora, bilateral lacrimal glands: Secondary | ICD-10-CM | POA: Diagnosis not present

## 2018-10-06 DIAGNOSIS — H04123 Dry eye syndrome of bilateral lacrimal glands: Secondary | ICD-10-CM | POA: Diagnosis not present

## 2018-10-06 DIAGNOSIS — H2513 Age-related nuclear cataract, bilateral: Secondary | ICD-10-CM | POA: Diagnosis not present

## 2018-11-27 DIAGNOSIS — H902 Conductive hearing loss, unspecified: Secondary | ICD-10-CM | POA: Diagnosis not present

## 2018-11-27 DIAGNOSIS — H8091 Unspecified otosclerosis, right ear: Secondary | ICD-10-CM | POA: Diagnosis not present

## 2018-11-27 DIAGNOSIS — Z9889 Other specified postprocedural states: Secondary | ICD-10-CM | POA: Diagnosis not present

## 2018-12-05 DIAGNOSIS — Z86010 Personal history of colonic polyps: Secondary | ICD-10-CM | POA: Diagnosis not present

## 2018-12-05 DIAGNOSIS — K219 Gastro-esophageal reflux disease without esophagitis: Secondary | ICD-10-CM | POA: Diagnosis not present

## 2018-12-05 DIAGNOSIS — R1310 Dysphagia, unspecified: Secondary | ICD-10-CM | POA: Diagnosis not present

## 2018-12-06 ENCOUNTER — Other Ambulatory Visit: Payer: Self-pay | Admitting: Gastroenterology

## 2018-12-06 DIAGNOSIS — R1310 Dysphagia, unspecified: Secondary | ICD-10-CM

## 2018-12-07 ENCOUNTER — Ambulatory Visit
Admission: RE | Admit: 2018-12-07 | Discharge: 2018-12-07 | Disposition: A | Discharge status: Home / Self Care | Payer: Medicare HMO | Source: Ambulatory Visit | Attending: Gastroenterology | Admitting: Gastroenterology

## 2018-12-07 DIAGNOSIS — K449 Diaphragmatic hernia without obstruction or gangrene: Secondary | ICD-10-CM | POA: Diagnosis not present

## 2018-12-07 DIAGNOSIS — K219 Gastro-esophageal reflux disease without esophagitis: Secondary | ICD-10-CM | POA: Diagnosis not present

## 2019-01-08 DIAGNOSIS — N401 Enlarged prostate with lower urinary tract symptoms: Secondary | ICD-10-CM | POA: Diagnosis not present

## 2019-01-08 DIAGNOSIS — R3912 Poor urinary stream: Secondary | ICD-10-CM | POA: Diagnosis not present

## 2019-01-08 DIAGNOSIS — R102 Pelvic and perineal pain: Secondary | ICD-10-CM | POA: Diagnosis not present

## 2019-01-23 DIAGNOSIS — N401 Enlarged prostate with lower urinary tract symptoms: Secondary | ICD-10-CM | POA: Diagnosis not present

## 2019-01-23 DIAGNOSIS — N390 Urinary tract infection, site not specified: Secondary | ICD-10-CM | POA: Diagnosis not present

## 2019-01-23 DIAGNOSIS — R3912 Poor urinary stream: Secondary | ICD-10-CM | POA: Diagnosis not present

## 2019-01-29 DIAGNOSIS — R1310 Dysphagia, unspecified: Secondary | ICD-10-CM | POA: Diagnosis not present

## 2019-01-29 DIAGNOSIS — R933 Abnormal findings on diagnostic imaging of other parts of digestive tract: Secondary | ICD-10-CM | POA: Diagnosis not present

## 2019-01-29 DIAGNOSIS — K449 Diaphragmatic hernia without obstruction or gangrene: Secondary | ICD-10-CM | POA: Diagnosis not present

## 2019-01-29 DIAGNOSIS — K219 Gastro-esophageal reflux disease without esophagitis: Secondary | ICD-10-CM | POA: Diagnosis not present

## 2019-03-14 ENCOUNTER — Other Ambulatory Visit: Payer: Self-pay

## 2019-03-14 ENCOUNTER — Ambulatory Visit (INDEPENDENT_AMBULATORY_CARE_PROVIDER_SITE_OTHER): Payer: Medicare HMO | Admitting: Family Medicine

## 2019-03-14 ENCOUNTER — Encounter: Payer: Self-pay | Admitting: Family Medicine

## 2019-03-14 VITALS — Resp 16

## 2019-03-14 DIAGNOSIS — J329 Chronic sinusitis, unspecified: Secondary | ICD-10-CM

## 2019-03-14 DIAGNOSIS — J069 Acute upper respiratory infection, unspecified: Secondary | ICD-10-CM | POA: Diagnosis not present

## 2019-03-14 DIAGNOSIS — J988 Other specified respiratory disorders: Secondary | ICD-10-CM

## 2019-03-14 MED ORDER — DOXYCYCLINE HYCLATE 100 MG PO TABS: 100.0000 mg | ORAL_TABLET | Freq: Two times a day (BID) | ORAL | 0 refills | Status: AC

## 2019-03-14 NOTE — Progress Notes (Signed)
Patient ID: Howard Davenport, male    DOB: 09/18/1946, 73 y.o.   MRN: [id number]  PCP: Susy Frizzle, MD  Virtual Visit via telephone  Phone visit arranged with Drue Stager for 03/14/19 at 10:15 AM EDT  Services provided today were via telemedicine through telephone call. Start of phone call:  11:18 AM   I verified that I was speaking with the correct person using two identifiers. Patient reported their location during encounter was at home   Patient consented to telephone visit  I conducted telephone visit from [facility]  Referring Provider:   Susy Frizzle, MD   All participants in encounter:  Myself and the patient   I discussed the limitations, risks, security and privacy concerns of performing an evaluation and management service by telephone and the availability of in person appointments. I also discussed with the patient that there may be a patient responsible charge related to this service. The patient expressed understanding and agreed to proceed.  Chief Complaint  Patient presents with  . Sinusitis  . Cough    Subjective:   Howard Davenport is a 73 y.o. male,  CC of URI sx, fever, cough  4 days ago fever 102 for 2 days, now "low grade fever" every night with chills Also nasal congestion and chest congestion  Has nasal allergies started and have been "worse" for about a month - including eyes watery, nasal drainage, post-nasal drip.  Howard Davenport started taking allergy pills and that helped a little bit with some of the post-nasal drip.  Doesn't know what Howard Davenport took found something in the cabinet at home that said for allergies.  But 4 days ago his sx worsened.  His describes having coughing fits, "hacking fits" coughing up yellow phlegm, which feels similar to when Howard Davenport had "walking pneumonia" in the past - "just don't feel good" Howard Davenport is still working and doing well today working outside during the telephone call, Howard Davenport denies associated CP, wheeze, SOB,  myalgias, N/V/D, rash, HA's, lymphadenopathy, fatigue, confusion.    His wife did put a pulse ox on him, the other day it was 90% and earlier today it was 97%. Howard Davenport is pretty healthy, has some GERD and scarring (Howard Davenport states) denies any hx of lung disease or heart disease, recently a thoracic aorta aneurysm was found that requires monitoring.    Patient Active Problem List   Diagnosis Date Noted  . Ascending aortic aneurysm ([place])   . Colon polyp   . Vasovagal syncope 02/12/2014  . Near syncope 01/30/2014  . GERD (gastroesophageal reflux disease)   . PVD 12/16/2009  . PALPITATIONS 12/12/2009  . CHEST PAIN 12/12/2009    Prior to Admission medications   Medication Sig Start Date End Date Taking? Authorizing Provider  ALFALFA PO Take 2 tablets by mouth 2 (two) times daily.     [provider]  benzocaine (AMERICAINE) 20 % rectal ointment Place rectally every 4 (four) hours as needed for pain. 03/10/18   Delsa Grana, PA-C  hydrocortisone (ANUSOL-HC) 2.5 % rectal cream Place 1 application rectally 2 (two) times daily. 03/10/18   Delsa Grana, PA-C  OVER THE COUNTER MEDICATION Take 1 capsule by mouth daily. Beta Sitosterol supplyment    [provider]  Probiotic Product (PROBIOTIC DAILY PO) Take 1 tablet by mouth daily.    [provider]  vitamin E 400 UNIT capsule Take 400 Units by mouth daily.    [provider]  No Known Allergies  Review of Systems  Constitutional: Positive for chills and fever. Negative for activity change, appetite change, diaphoresis and unexpected weight change.  HENT: Negative.   Eyes: Negative.  Negative for pain, redness and itching.  Respiratory: Negative.  Negative for apnea, choking, chest tightness, shortness of breath, wheezing and stridor.   Cardiovascular: Negative.  Negative for chest pain and leg swelling.  Gastrointestinal: Negative.  Negative for abdominal pain, diarrhea, nausea and vomiting.  Endocrine: Negative.   Negative for polyuria.  Genitourinary: Negative.   Musculoskeletal: Negative.  Negative for arthralgias and myalgias.  Skin: Negative.  Negative for color change, pallor and rash.  Allergic/Immunologic: Positive for environmental allergies. Negative for immunocompromised state.  Neurological: Negative.  Negative for tremors, syncope, weakness and light-headedness.  Hematological: Negative.  Negative for adenopathy.  Psychiatric/Behavioral: Negative.  Negative for sleep disturbance.  All other systems reviewed and are negative.      Objective:    Vitals:   03/14/19 1138  Resp: 16  SpO2: 97%      Physical Exam Vitals signs reviewed.  Pulmonary:     Effort: Pulmonary effort is normal. No respiratory distress.     Breath sounds: No stridor. No wheezing (no audible).  Neurological:     Mental Status: Howard Davenport is alert.     Limited, pt able to speak in full sentences while walking and talking on cell phone.  No tachypnea, no audible wheeze or stridor.  No coughing throughout phone call. Denies ttp to sinuses (forehead and b/l maxillary) Temp 3 and 4 days ago 102.0, but has not taken in the past 2 days, encouraged him to monitor temp, pulse ox and HR since Howard Davenport is able to at home.     Assessment & Plan:     ICD-10-CM   1. Sinusitis, unspecified chronicity, unspecified location J32.9 doxycycline (VIBRA-TABS) 100 MG tablet   nasal sx x 1 month, worsening x 4 d, doxy on hold for if pt develops facial pain with worsening sx  2. Upper respiratory tract infection, unspecified type J06.9    URI viral sx with worsening nasal sx, productive cough (see COVID below), pulse ox good, no SOB or CP  3. Respiratory tract infection due to COVID-19 virus U07.1 MyChart COVID-19 home monitoring program   J98.8 Temperature monitoring   possible - pt agrees to monitoring.  Red flags reviewed, ER precautions reviewed. Currently NAD     I discussed with the pt at length the sx Howard Davenport was having and possibility  that Howard Davenport may be COVID + and without access to ample testing, I offered [facility] MyChart monitoring.  Howard Davenport agreed to monitoring.  Explained emergent symptoms which Howard Davenport should call us ASAP or go to the ER for further evaluation.  Explained to him not to hestitate with CP, SOB, severe fatigue or confusion, and urged him to use ED or 911 when required.  Further explained testing or no testing would not change the management or the monitoring right now, but would affect him if and other if hospitalized, so right now resources are reserved for pt's being admitted.    I discussed the assessment and treatment plan with the patient. The patient was provided an opportunity to ask questions and all were answered. The patient agreed with the plan and demonstrated an understanding of the instructions.   The patient was advised to call back or seek an in-person evaluation if the symptoms worsen or if the condition fails to improve as anticipated.  Phone call  concluded at 11:37 I provided 18  minutes of non-face-to-face time during this encounter.  Delsa Grana, PA-C 03/14/19 10:48 AM

## 2019-04-30 DIAGNOSIS — D2262 Melanocytic nevi of left upper limb, including shoulder: Secondary | ICD-10-CM | POA: Diagnosis not present

## 2019-04-30 DIAGNOSIS — L821 Other seborrheic keratosis: Secondary | ICD-10-CM | POA: Diagnosis not present

## 2019-04-30 DIAGNOSIS — L812 Freckles: Secondary | ICD-10-CM | POA: Diagnosis not present

## 2019-04-30 DIAGNOSIS — L570 Actinic keratosis: Secondary | ICD-10-CM | POA: Diagnosis not present

## 2019-04-30 DIAGNOSIS — D225 Melanocytic nevi of trunk: Secondary | ICD-10-CM | POA: Diagnosis not present

## 2019-05-29 ENCOUNTER — Ambulatory Visit (INDEPENDENT_AMBULATORY_CARE_PROVIDER_SITE_OTHER): Payer: Medicare HMO | Admitting: Family Medicine

## 2019-05-29 ENCOUNTER — Other Ambulatory Visit: Payer: Medicare HMO

## 2019-05-29 ENCOUNTER — Telehealth: Payer: Self-pay

## 2019-05-29 ENCOUNTER — Other Ambulatory Visit: Payer: Self-pay

## 2019-05-29 DIAGNOSIS — R509 Fever, unspecified: Secondary | ICD-10-CM | POA: Diagnosis not present

## 2019-05-29 DIAGNOSIS — Z20822 Contact with and (suspected) exposure to covid-19: Secondary | ICD-10-CM

## 2019-05-29 DIAGNOSIS — R6889 Other general symptoms and signs: Secondary | ICD-10-CM | POA: Diagnosis not present

## 2019-05-29 MED ORDER — DOXYCYCLINE HYCLATE 100 MG PO TABS: 100.0000 mg | ORAL_TABLET | Freq: Two times a day (BID) | ORAL | 0 refills | Status: DC

## 2019-05-29 NOTE — Telephone Encounter (Signed)
-----   Message from Susy Frizzle, MD sent at 05/29/2019 10:06 AM EDT ----- Patient is a 73 year old male who has had fevers since Friday, diffuse body aches and flulike symptoms.  He denies any cough or shortness of breath however I do think he would warrant COVID testing.  Thank you

## 2019-05-29 NOTE — Progress Notes (Signed)
Subjective:    Patient ID: Howard Davenport, male    DOB: 15-Jul-1946, 73 y.o.   MRN: [id number]  HPI Patient is a 73 year old white male who presents today as a telephone visit.  He consents to be seen by telephone.  Phone call began at 957.  Phone call concluded at 1008.  Symptoms began on Friday with a low-grade subjective fever.  He believes his temperature is been above 100 although he is not checked to verify.  He reports diffuse body aches.  He states that his skin even touched her.  He feels weak and tired.  He is had an occasional cough but not severe.  He denies any shortness of breath.  He denies any chest pain.  He denies any rhinorrhea or sore throat.  He denies any nausea vomiting or diarrhea.  He denies any neck stiffness or headache.  He denies any rash.  He denies any sick contacts that he is aware of.  He does report more than 20 tick bites this summer Past Medical History:  Diagnosis Date  . Ascending aortic aneurysm ([place])    4 cm 2019  . Colon polyp    tubular adenoma 01/2018  . GERD (gastroesophageal reflux disease)   . Hiatal hernia   . Inguinal hernia    Past Surgical History:  Procedure Laterality Date  . HERNIA REPAIR     Current Outpatient Medications on File Prior to Visit  Medication Sig Dispense Refill  . ALFALFA PO Take 2 tablets by mouth 2 (two) times daily.     . benzocaine (AMERICAINE) 20 % rectal ointment Place rectally every 4 (four) hours as needed for pain. 28.4 g 1  . hydrocortisone (ANUSOL-HC) 2.5 % rectal cream Place 1 application rectally 2 (two) times daily. 30 g 0  . OVER THE COUNTER MEDICATION Take 1 capsule by mouth daily. Beta Sitosterol supplyment    . Probiotic Product (PROBIOTIC DAILY PO) Take 1 tablet by mouth daily.    . vitamin E 400 UNIT capsule Take 400 Units by mouth daily.     No current facility-administered medications on file prior to visit.    No Known Allergies Social History   Socioeconomic History  . Marital status:  Married    Spouse name: Not on file  . Number of children: Not on file  . Years of education: Not on file  . Highest education level: Not on file  Occupational History  . Not on file  Social Needs  . Financial resource strain: Not on file  . Food insecurity    Worry: Not on file    Inability: Not on file  . Transportation needs    Medical: Not on file    Non-medical: Not on file  Tobacco Use  . Smoking status: Never Smoker  . Smokeless tobacco: Never Used  Substance and Sexual Activity  . Alcohol use: No  . Drug use: No  . Sexual activity: Yes  Lifestyle  . Physical activity    Days per week: Not on file    Minutes per session: Not on file  . Stress: Not on file  Relationships  . Social Herbalist on phone: Not on file    Gets together: Not on file    Attends religious service: Not on file    Active member of club or organization: Not on file    Attends meetings of clubs or organizations: Not on file    Relationship status: Not on  file  . Intimate partner violence    Fear of current or ex partner: Not on file    Emotionally abused: Not on file    Physically abused: Not on file    Forced sexual activity: Not on file  Other Topics Concern  . Not on file  Social History Narrative   Married with 2 children   Right handed   12 th    3 cups daily      Review of Systems  All other systems reviewed and are negative.      Objective:   Physical Exam  Physical exam could not be performed today as patient was seen as a telephone visit however he is speaking full and complete sentences with no respiratory distress      Assessment & Plan:  The encounter diagnosis was Fever and chills. Patient has fever and chills and flulike symptoms in the summertime.  Given the current COVID-19 pandemic, I do think he would warrant COVID testing given his age as he would be a high risk population.  Therefore I will contact the patient education center to schedule this.   However given the numerous tick bites he has had this summer, tick fever/possible [facility] spotted fever is on the differential diagnosis.  Therefore I will treat the patient empirically with doxycycline 100 mg p.o. twice daily for 10 days while awaiting COVID testing.  I recommended that he quarantine himself at home and avoid contact with any other people until test results have returned.

## 2019-05-29 NOTE — Telephone Encounter (Signed)
Phone call to pt.  Scheduled for COVID test appt. at 3:15 PM today, at the [facility] [place].  Testing Protocol advised; verb. Understanding.

## 2019-06-02 LAB — NOVEL CORONAVIRUS, NAA: SARS-CoV-2, NAA: DETECTED — AB

## 2019-06-26 ENCOUNTER — Ambulatory Visit (INDEPENDENT_AMBULATORY_CARE_PROVIDER_SITE_OTHER): Payer: Medicare HMO | Admitting: Family Medicine

## 2019-06-26 ENCOUNTER — Ambulatory Visit
Admission: RE | Admit: 2019-06-26 | Discharge: 2019-06-26 | Disposition: A | Discharge status: Home / Self Care | Payer: Medicare HMO | Source: Ambulatory Visit | Attending: Family Medicine | Admitting: Family Medicine

## 2019-06-26 ENCOUNTER — Other Ambulatory Visit: Payer: Self-pay | Admitting: Family Medicine

## 2019-06-26 ENCOUNTER — Other Ambulatory Visit: Payer: Self-pay

## 2019-06-26 VITALS — BP 130/72 | HR 60 | Temp 97.9°F | Resp 18 | Ht 72.0 in | Wt 209.0 lb

## 2019-06-26 DIAGNOSIS — Z8616 Personal history of COVID-19: Secondary | ICD-10-CM

## 2019-06-26 DIAGNOSIS — R0602 Shortness of breath: Secondary | ICD-10-CM

## 2019-06-26 DIAGNOSIS — Z8619 Personal history of other infectious and parasitic diseases: Secondary | ICD-10-CM

## 2019-06-26 DIAGNOSIS — R05 Cough: Secondary | ICD-10-CM | POA: Diagnosis not present

## 2019-06-26 MED ORDER — BUDESONIDE-FORMOTEROL FUMARATE 160-4.5 MCG/ACT IN AERO: 2.0000 | INHALATION_SPRAY | Freq: Two times a day (BID) | RESPIRATORY_TRACT | 3 refills | Status: DC

## 2019-06-26 NOTE — Progress Notes (Signed)
Subjective:    Patient ID: Howard Davenport, male    DOB: 1946/03/02, 73 y.o.   MRN: [id number]  HPI Patient is a 73 year old white male who was diagnosed with COVID-19 around July 14.  Symptoms included fever chills and cough.  Patient's fever has subsided however he continues to have cough and dyspnea on exertion.  He states that the cough is nonproductive.  However he will frequently get coughing fits throughout the day.  He denies any purulent sputum.  He denies any hemoptysis.  He denies any pleurisy.  However he does have some mild shortness of breath.  He states that especially when he is outside in the heat, he is unable to catch his breath if he tries to do any physical work.  Inside he does fine suggesting an element of reactive airway disease brought on by the heat.  He denies any orthopnea.  He denies any paroxysmal nocturnal dyspnea.  He denies any chest pain.  There is no pitting edema in his extremities.  There is no unilateral edema to suggest a DVT.  He denies any leg pain. Past Medical History:  Diagnosis Date  . Ascending aortic aneurysm ([place])    4 cm 2019  . Colon polyp    tubular adenoma 01/2018  . GERD (gastroesophageal reflux disease)   . Hiatal hernia   . Inguinal hernia    Past Surgical History:  Procedure Laterality Date  . HERNIA REPAIR     Current Outpatient Medications on File Prior to Visit  Medication Sig Dispense Refill  . ALFALFA PO Take 2 tablets by mouth 2 (two) times daily.     Marland Kitchen OVER THE COUNTER MEDICATION Take 1 capsule by mouth daily. Beta Sitosterol supplyment    . Probiotic Product (PROBIOTIC DAILY PO) Take 1 tablet by mouth daily.    . vitamin E 400 UNIT capsule Take 400 Units by mouth daily.     No current facility-administered medications on file prior to visit.    No Known Allergies Social History   Socioeconomic History  . Marital status: Married    Spouse name: Not on file  . Number of children: Not on file  . Years of education: Not on  file  . Highest education level: Not on file  Occupational History  . Not on file  Social Needs  . Financial resource strain: Not on file  . Food insecurity    Worry: Not on file    Inability: Not on file  . Transportation needs    Medical: Not on file    Non-medical: Not on file  Tobacco Use  . Smoking status: Never Smoker  . Smokeless tobacco: Never Used  Substance and Sexual Activity  . Alcohol use: No  . Drug use: No  . Sexual activity: Yes  Lifestyle  . Physical activity    Days per week: Not on file    Minutes per session: Not on file  . Stress: Not on file  Relationships  . Social Herbalist on phone: Not on file    Gets together: Not on file    Attends religious service: Not on file    Active member of club or organization: Not on file    Attends meetings of clubs or organizations: Not on file    Relationship status: Not on file  . Intimate partner violence    Fear of current or ex partner: Not on file    Emotionally abused: Not on file  Physically abused: Not on file    Forced sexual activity: Not on file  Other Topics Concern  . Not on file  Social History Narrative   Married with 2 children   Right handed   12 th    3 cups daily      Review of Systems  All other systems reviewed and are negative.      Objective:   Physical Exam Constitutional:      General: He is not in acute distress.    Appearance: Normal appearance. He is not ill-appearing, toxic-appearing or diaphoretic.  HENT:     Right Ear: Tympanic membrane and ear canal normal.     Left Ear: Tympanic membrane and ear canal normal.     Nose: Congestion present. No rhinorrhea.     Mouth/Throat:     Mouth: Mucous membranes are moist.     Pharynx: No oropharyngeal exudate.  Eyes:     Conjunctiva/sclera: Conjunctivae normal.  Cardiovascular:     Rate and Rhythm: Normal rate and regular rhythm.     Heart sounds: Normal heart sounds. No murmur. No friction rub. No gallop.    Pulmonary:     Effort: Pulmonary effort is normal. No respiratory distress.     Breath sounds: Normal breath sounds. No stridor. No wheezing, rhonchi or rales.  Chest:     Chest wall: No tenderness.  Musculoskeletal:     Right lower leg: No edema.     Left lower leg: No edema.  Lymphadenopathy:     Cervical: No cervical adenopathy.  Neurological:     Mental Status: He is alert.           Assessment & Plan:  The primary encounter diagnosis was SOB (shortness of breath). A diagnosis of History of 2019 novel coronavirus disease (COVID-19) was also pertinent to this visit. I will obtain a chest x-ray to rule out community-acquired pneumonia or a secondary bacterial pneumonia after his recent COVID-19 infection.  If chest x-ray is clear, I believe the patient likely has a post infectious bronchitis.  Anecdotal reports have suggested benefit from inhaled budesonide.  Given the patient's dyspnea with heat, his history suggest an element of reactive airway disease.  Therefore if his chest x-ray is clear, I will empirically try the patient on Symbicort 160/4.52 puffs inhaled twice daily to see if this will help his symptoms to improve.  Obviously if there is a pneumonia, I would recommend antibiotics.  There may also be an element of upper airway cough syndrome brought on by postnasal drip/sinusitis based on his congestion.  Therefore I may also add an antihistamine to the Symbicort assuming the chest x-ray is clear.

## 2019-07-17 ENCOUNTER — Other Ambulatory Visit: Payer: Self-pay | Admitting: Family Medicine

## 2019-07-17 ENCOUNTER — Ambulatory Visit
Admission: RE | Admit: 2019-07-17 | Discharge: 2019-07-17 | Disposition: A | Discharge status: Home / Self Care | Payer: Medicare HMO | Source: Ambulatory Visit | Attending: Family Medicine | Admitting: Family Medicine

## 2019-07-17 ENCOUNTER — Other Ambulatory Visit: Payer: Self-pay

## 2019-07-17 DIAGNOSIS — I712 Thoracic aortic aneurysm, without rupture: Secondary | ICD-10-CM | POA: Diagnosis not present

## 2019-07-17 DIAGNOSIS — I729 Aneurysm of unspecified site: Secondary | ICD-10-CM

## 2019-07-17 MED ORDER — IOPAMIDOL (ISOVUE-370) INJECTION 76%
75.0000 mL | Freq: Once | INTRAVENOUS | Status: AC | PRN
  Administered 2019-07-17: 75 mL via INTRAVENOUS

## 2019-09-03 ENCOUNTER — Other Ambulatory Visit: Payer: Self-pay

## 2019-09-03 ENCOUNTER — Ambulatory Visit (INDEPENDENT_AMBULATORY_CARE_PROVIDER_SITE_OTHER): Payer: Medicare HMO

## 2019-09-03 DIAGNOSIS — Z23 Encounter for immunization: Secondary | ICD-10-CM

## 2019-10-26 DIAGNOSIS — N401 Enlarged prostate with lower urinary tract symptoms: Secondary | ICD-10-CM | POA: Diagnosis not present

## 2019-10-26 DIAGNOSIS — R3912 Poor urinary stream: Secondary | ICD-10-CM | POA: Diagnosis not present

## 2019-10-26 DIAGNOSIS — R102 Pelvic and perineal pain: Secondary | ICD-10-CM | POA: Diagnosis not present

## 2019-10-26 DIAGNOSIS — N5201 Erectile dysfunction due to arterial insufficiency: Secondary | ICD-10-CM | POA: Diagnosis not present

## 2019-11-28 DIAGNOSIS — H04123 Dry eye syndrome of bilateral lacrimal glands: Secondary | ICD-10-CM | POA: Diagnosis not present

## 2019-11-28 DIAGNOSIS — H2513 Age-related nuclear cataract, bilateral: Secondary | ICD-10-CM | POA: Diagnosis not present

## 2019-11-28 DIAGNOSIS — H04203 Unspecified epiphora, bilateral lacrimal glands: Secondary | ICD-10-CM | POA: Diagnosis not present

## 2019-12-31 ENCOUNTER — Other Ambulatory Visit: Payer: Self-pay

## 2019-12-31 ENCOUNTER — Encounter: Payer: Self-pay | Admitting: Family Medicine

## 2019-12-31 ENCOUNTER — Ambulatory Visit (INDEPENDENT_AMBULATORY_CARE_PROVIDER_SITE_OTHER): Payer: Medicare HMO | Admitting: Family Medicine

## 2019-12-31 VITALS — BP 98/60 | HR 70 | Temp 97.8°F | Resp 14 | Ht 72.0 in | Wt 214.0 lb

## 2019-12-31 DIAGNOSIS — M79604 Pain in right leg: Secondary | ICD-10-CM

## 2019-12-31 NOTE — Progress Notes (Signed)
Subjective:    Patient ID: Howard Davenport, male    DOB: 1946/08/02, 74 y.o.   MRN: [id number]  HPI Patient fell over the weekend off a trailer.  His right leg slipped and went underneath him.  His knee was hyperflexed and he fell on top of it.  Ever since that time he has had pain and tenderness over his distal lateral right quadricep.  He is tender to palpation in that area.  There is no palpable defect although it is very swollen.  He has pain with resisted extension of the knee however he does have normal strength with knee extension.  There is no evidence of a complete rupture of the quadricep.  However the patient also has pain within the knee.  He has a negative McMurray test.  He has a negative Apley grind.  He does have some instability on Lachman test suggesting a possible strain versus tear of the ACL.  He has no laxity to varus or valgus stress.  Past Medical History:  Diagnosis Date  . Ascending aortic aneurysm ([place])    4 cm 2019  . Colon polyp    tubular adenoma 01/2018  . GERD (gastroesophageal reflux disease)   . Hiatal hernia   . Inguinal hernia    Past Surgical History:  Procedure Laterality Date  . HERNIA REPAIR     Current Outpatient Medications on File Prior to Visit  Medication Sig Dispense Refill  . ALFALFA PO Take 2 tablets by mouth 2 (two) times daily.     Marland Kitchen OVER THE COUNTER MEDICATION Take 1 capsule by mouth daily. Beta Sitosterol supplyment    . Probiotic Product (PROBIOTIC DAILY PO) Take 1 tablet by mouth daily.    . vitamin E 400 UNIT capsule Take 400 Units by mouth daily.     No current facility-administered medications on file prior to visit.   No Known Allergies Social History   Socioeconomic History  . Marital status: Married    Spouse name: Not on file  . Number of children: Not on file  . Years of education: Not on file  . Highest education level: Not on file  Occupational History  . Not on file  Tobacco Use  . Smoking status: Never Smoker   . Smokeless tobacco: Never Used  Substance and Sexual Activity  . Alcohol use: No  . Drug use: No  . Sexual activity: Yes  Other Topics Concern  . Not on file  Social History Narrative   Married with 2 children   Right handed   12 th    3 cups daily   Social Determinants of Health   Financial Resource Strain:   . Difficulty of Paying Living Expenses: Not on file  Food Insecurity:   . Worried About Charity fundraiser in the Last Year: Not on file  . Ran Out of Food in the Last Year: Not on file  Transportation Needs:   . Lack of Transportation (Medical): Not on file  . Lack of Transportation (Non-Medical): Not on file  Physical Activity:   . Days of Exercise per Week: Not on file  . Minutes of Exercise per Session: Not on file  Stress:   . Feeling of Stress : Not on file  Social Connections:   . Frequency of Communication with Friends and Family: Not on file  . Frequency of Social Gatherings with Friends and Family: Not on file  . Attends Religious Services: Not on file  . Active  Member of Clubs or Organizations: Not on file  . Attends Archivist Meetings: Not on file  . Marital Status: Not on file  Intimate Partner Violence:   . Fear of Current or Ex-Partner: Not on file  . Emotionally Abused: Not on file  . Physically Abused: Not on file  . Sexually Abused: Not on file      Review of Systems  All other systems reviewed and are negative.      Objective:   Physical Exam Constitutional:      General: He is not in acute distress.    Appearance: Normal appearance. He is not ill-appearing, toxic-appearing or diaphoretic.  Cardiovascular:     Rate and Rhythm: Normal rate and regular rhythm.     Heart sounds: Normal heart sounds. No murmur. No friction rub. No gallop.   Pulmonary:     Effort: Pulmonary effort is normal. No respiratory distress.     Breath sounds: Normal breath sounds. No stridor. No wheezing, rhonchi or rales.  Chest:     Chest wall:  No tenderness.  Musculoskeletal:     Right upper leg: Tenderness present. No swelling, edema or bony tenderness.     Right knee: No effusion, erythema or ecchymosis. ACL laxity present. No LCL laxity, MCL laxity or PCL laxity.     Right lower leg: No edema.     Left lower leg: No edema.       Legs:  Lymphadenopathy:     Cervical: No cervical adenopathy.  Neurological:     Mental Status: He is alert.           Assessment & Plan:  Right leg pain  I believe the patient strained his right quadriceps.  I see no evidence of a complete rupture of the quadriceps tendon as he is still able to extend his knee against resistance and gravity albeit with pain.  I am concerned that he is strained his ACL.  I cannot rule out an ACL tear.  I have recommended some gentle stretching activity.  Advance activity slowly as tolerated.  Take ibuprofen for pain and swelling.  Reassess in 2 weeks or sooner if worsening.

## 2020-03-18 DIAGNOSIS — D1730 Benign lipomatous neoplasm of skin and subcutaneous tissue of unspecified sites: Secondary | ICD-10-CM | POA: Diagnosis not present

## 2020-03-18 DIAGNOSIS — J309 Allergic rhinitis, unspecified: Secondary | ICD-10-CM | POA: Diagnosis not present

## 2020-03-18 DIAGNOSIS — R490 Dysphonia: Secondary | ICD-10-CM | POA: Diagnosis not present

## 2020-04-30 DIAGNOSIS — D1801 Hemangioma of skin and subcutaneous tissue: Secondary | ICD-10-CM | POA: Diagnosis not present

## 2020-04-30 DIAGNOSIS — L821 Other seborrheic keratosis: Secondary | ICD-10-CM | POA: Diagnosis not present

## 2020-04-30 DIAGNOSIS — D225 Melanocytic nevi of trunk: Secondary | ICD-10-CM | POA: Diagnosis not present

## 2020-04-30 DIAGNOSIS — L812 Freckles: Secondary | ICD-10-CM | POA: Diagnosis not present

## 2020-04-30 DIAGNOSIS — D170 Benign lipomatous neoplasm of skin and subcutaneous tissue of head, face and neck: Secondary | ICD-10-CM | POA: Diagnosis not present

## 2020-04-30 DIAGNOSIS — L570 Actinic keratosis: Secondary | ICD-10-CM | POA: Diagnosis not present

## 2020-06-23 ENCOUNTER — Other Ambulatory Visit: Payer: Self-pay

## 2020-06-23 ENCOUNTER — Telehealth (INDEPENDENT_AMBULATORY_CARE_PROVIDER_SITE_OTHER): Payer: Medicare HMO | Admitting: Family Medicine

## 2020-06-23 DIAGNOSIS — R509 Fever, unspecified: Secondary | ICD-10-CM | POA: Diagnosis not present

## 2020-06-23 MED ORDER — DOXYCYCLINE HYCLATE 100 MG PO TABS: 100.0000 mg | ORAL_TABLET | Freq: Two times a day (BID) | ORAL | 0 refills | Status: DC

## 2020-06-23 NOTE — Progress Notes (Signed)
Subjective:    Patient ID: Howard Davenport, male    DOB: 06-16-1946, 74 y.o.   MRN: [id number]  HPI  Patient is being seen today as a telephone visit.  He consents to be seen via telephone.  Phone call began at 413.  Phone call concluded at 423.  For the last 4 days, the patient complains of severe pains in both hips, both knees, both elbows and both shoulders.  He has had a fever to 100.  He feels fatigue.  He feels achy all over.  He denies any cough.  He denies any chest pain.  He denies any rhinorrhea.  He denies any dysuria.  He denies any sore throat.  He denies any nausea or vomiting or diarrhea.  He did have a recent deer tick bite.  He had Covid last year.  He never got the Covid vaccine. Past Medical History:  Diagnosis Date  . Ascending aortic aneurysm ([place])    4 cm 2019  . Colon polyp    tubular adenoma 01/2018  . GERD (gastroesophageal reflux disease)   . Hiatal hernia   . Inguinal hernia    Past Surgical History:  Procedure Laterality Date  . HERNIA REPAIR     Current Outpatient Medications on File Prior to Visit  Medication Sig Dispense Refill  . ALFALFA PO Take 2 tablets by mouth 2 (two) times daily.     Marland Kitchen OVER THE COUNTER MEDICATION Take 1 capsule by mouth daily. Beta Sitosterol supplyment    . Probiotic Product (PROBIOTIC DAILY PO) Take 1 tablet by mouth daily.    . vitamin E 400 UNIT capsule Take 400 Units by mouth daily.     No current facility-administered medications on file prior to visit.   No Known Allergies Social History   Socioeconomic History  . Marital status: Married    Spouse name: Not on file  . Number of children: Not on file  . Years of education: Not on file  . Highest education level: Not on file  Occupational History  . Not on file  Tobacco Use  . Smoking status: Never Smoker  . Smokeless tobacco: Never Used  Substance and Sexual Activity  . Alcohol use: No  . Drug use: No  . Sexual activity: Yes  Other Topics Concern  . Not on  file  Social History Narrative   Married with 2 children   Right handed   12 th    3 cups daily   Social Determinants of Health   Financial Resource Strain:   . Difficulty of Paying Living Expenses:   Food Insecurity:   . Worried About Charity fundraiser in the Last Year:   . Arboriculturist in the Last Year:   Transportation Needs:   . Film/video editor (Medical):   Marland Kitchen Lack of Transportation (Non-Medical):   Physical Activity:   . Days of Exercise per Week:   . Minutes of Exercise per Session:   Stress:   . Feeling of Stress :   Social Connections:   . Frequency of Communication with Friends and Family:   . Frequency of Social Gatherings with Friends and Family:   . Attends Religious Services:   . Active Member of Clubs or Organizations:   . Attends Archivist Meetings:   Marland Kitchen Marital Status:   Intimate Partner Violence:   . Fear of Current or Ex-Partner:   . Emotionally Abused:   Marland Kitchen Physically Abused:   .  Sexually Abused:      Review of Systems  All other systems reviewed and are negative.      Objective:   Physical Exam        Assessment & Plan:  Fever and chills - Plan: SARS-COV-2 RNA,(COVID-19) QUAL NAAT  Patient was seen via telephone.  I recommended he come to the clinic so that I could swab his nose to test for COVID-19.  He may possibly have a delta variant and have a breakthrough case.  I recommended quarantine until we have the results of his test.  The other possibility would be a tickborne illness such as [facility] spotted fever or Lyme disease.  Another possibility would be another viral syndrome.  Because of his possible Covid exposure I am unable to bring him into clinic for lab work.  Therefore I will treat the patient empirically with doxycycline 100 mg twice daily for 10 days while awaiting the results of his Covid test

## 2020-06-24 LAB — SARS-COV-2 RNA,(COVID-19) QUALITATIVE NAAT: SARS CoV2 RNA: NOT DETECTED

## 2020-07-23 ENCOUNTER — Other Ambulatory Visit: Payer: Self-pay

## 2020-07-23 ENCOUNTER — Telehealth (INDEPENDENT_AMBULATORY_CARE_PROVIDER_SITE_OTHER): Payer: Medicare HMO | Admitting: Nurse Practitioner

## 2020-07-23 VITALS — BP 120/72 | HR 74 | Temp 97.6°F | Resp 18 | Wt 214.4 lb

## 2020-07-23 DIAGNOSIS — R059 Cough, unspecified: Secondary | ICD-10-CM

## 2020-07-23 DIAGNOSIS — R05 Cough: Secondary | ICD-10-CM | POA: Diagnosis not present

## 2020-07-23 DIAGNOSIS — J00 Acute nasopharyngitis [common cold]: Secondary | ICD-10-CM

## 2020-07-23 DIAGNOSIS — R0982 Postnasal drip: Secondary | ICD-10-CM | POA: Diagnosis not present

## 2020-07-23 DIAGNOSIS — Z8719 Personal history of other diseases of the digestive system: Secondary | ICD-10-CM

## 2020-07-23 MED ORDER — FLUTICASONE PROPIONATE 50 MCG/ACT NA SUSP: 2.0000 | Freq: Every day | NASAL | 6 refills | Status: DC

## 2020-07-23 NOTE — Patient Instructions (Signed)
rhinintis with png could be contributing to triggering a cough  H/o gerd and hiatal hernia may have acid reflux esophagitis causing cough

## 2020-07-23 NOTE — Progress Notes (Signed)
Established Patient Office Visit  Subjective:  Patient ID: Howard Davenport, male    DOB: Feb 03, 1946  Age: 74 y.o. MRN: [id number]  CC: cough and left chest pressure 2-3 weeks  HPI Howard Davenport is a 74 year old male presents for dry cough that he has had on and off for about 4 weeks.  sxs pressure left side np dry cough the cough can become worse with activity  Pt denied fever chills, loss of taste or smell, headache, general body aches, nasal congestion or drainage.  No sick contacts Has had COVID last year and COVID vaccinations. No cp, ct, gu/gi sxs, pain, sob, edema, palpations, fatigue, fall or injury.  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   Past Medical History:  Diagnosis Date  . Ascending aortic aneurysm ([place])    4 cm 2019  . Colon polyp    tubular adenoma 01/2018  . GERD (gastroesophageal reflux disease)   . Hiatal hernia   . Inguinal hernia     Past Surgical History:  Procedure Laterality Date  . HERNIA REPAIR      Family History  Problem Relation Age of Onset  . Leukemia Mother   . Heart disease Father     Social History   Socioeconomic History  . Marital status: Married    Spouse name: Not on file  . Number of children: Not on file  . Years of education: Not on file  . Highest education level: Not on file  Occupational History  . Not on file  Tobacco Use  . Smoking status: Never Smoker  . Smokeless tobacco: Never Used  Substance and Sexual Activity  . Alcohol use: No  . Drug use: No  . Sexual activity: Yes  Other Topics Concern  . Not on file  Social History Narrative   Married with 2 children   Right handed   12 th    3 cups daily   Social Determinants of Health   Financial Resource Strain:   . Difficulty of Paying Living Expenses: Not on file  Food Insecurity:   . Worried About Charity fundraiser in the Last Year: Not on file  . Ran Out of Food in the Last  Year: Not on file  Transportation Needs:   . Lack of Transportation (Medical): Not on file  . Lack of Transportation (Non-Medical): Not on file  Physical Activity:   . Days of Exercise per Week: Not on file  . Minutes of Exercise per Session: Not on file  Stress:   . Feeling of Stress : Not on file  Social Connections:   . Frequency of Communication with Friends and Family: Not on file  . Frequency of Social Gatherings with Friends and Family: Not on file  . Attends Religious Services: Not on file  . Active Member of Clubs or Organizations: Not on file  . Attends Archivist Meetings: Not on file  . Marital Status: Not on file  Intimate Partner Violence:   . Fear of Current or Ex-Partner: Not on file  . Emotionally Abused: Not on file  . Physically Abused: Not on file  . Sexually Abused: Not on file    Outpatient Medications Prior to Visit  Medication Sig Dispense Refill  . OVER THE COUNTER MEDICATION Take 1 capsule by mouth daily. Beta Sitosterol supplyment    . Probiotic Product (PROBIOTIC DAILY PO) Take 1  tablet by mouth daily.    . vitamin E 400 UNIT capsule Take 400 Units by mouth daily.    Marland Kitchen ALFALFA PO Take 2 tablets by mouth 2 (two) times daily.     Marland Kitchen doxycycline (VIBRA-TABS) 100 MG tablet Take 1 tablet (100 mg total) by mouth 2 (two) times daily. 20 tablet 0   No facility-administered medications prior to visit.    No Known Allergies  ROS Review of Systems  All other systems reviewed and are negative.     Objective:    Physical Exam Vitals and nursing note reviewed.  Constitutional:      General: He is not in acute distress.    Appearance: Normal appearance. He is well-developed and well-groomed. He is not ill-appearing, toxic-appearing or diaphoretic.  HENT:     Head: Normocephalic and atraumatic.     Right Ear: Hearing, tympanic membrane, ear canal and external ear normal.     Left Ear: Hearing, tympanic membrane, ear canal and external ear  normal.     Nose: Mucosal edema and rhinorrhea present. No septal deviation, nasal tenderness or congestion.     Mouth/Throat:     Lips: Pink.     Mouth: Mucous membranes are moist.     Dentition: Normal dentition. No dental tenderness.     Tongue: No lesions. Tongue does not deviate from midline.     Pharynx: Oropharynx is clear. Uvula midline. No oropharyngeal exudate or posterior oropharyngeal erythema.  Eyes:     General: Lids are normal. Lids are everted, no foreign bodies appreciated.     Extraocular Movements: Extraocular movements intact.     Conjunctiva/sclera: Conjunctivae normal.     Pupils: Pupils are equal, round, and reactive to light.  Neck:     Thyroid: No thyromegaly or thyroid tenderness.     Vascular: No carotid bruit or JVD.  Cardiovascular:     Rate and Rhythm: Normal rate and regular rhythm.     Pulses: Normal pulses.     Heart sounds: Normal heart sounds.  Pulmonary:     Effort: Pulmonary effort is normal.     Breath sounds: Normal breath sounds.  Abdominal:     General: Abdomen is flat. Bowel sounds are normal. There is no abdominal bruit.     Palpations: Abdomen is soft.     Tenderness: There is no abdominal tenderness. There is no rebound.  Musculoskeletal:        General: Normal range of motion.     Cervical back: Full passive range of motion without pain, normal range of motion and neck supple.     Right lower leg: No edema.     Left lower leg: No edema.  Lymphadenopathy:     Head:     Right side of head: No submental, submandibular or tonsillar adenopathy.     Left side of head: No submental, submandibular or tonsillar adenopathy.     Cervical: No cervical adenopathy.     Right cervical: No superficial cervical adenopathy.    Left cervical: No superficial cervical adenopathy.  Skin:    General: Skin is warm and dry.     Capillary Refill: Capillary refill takes less than 2 seconds.     Coloration: Skin is not cyanotic, jaundiced or pale.   Neurological:     General: No focal deficit present.     Mental Status: He is alert and oriented to person, place, and time.     Motor: No weakness.     Gait:  Gait normal.  Psychiatric:        Attention and Perception: Attention normal.        Mood and Affect: Mood normal.        Speech: Speech normal.        Behavior: Behavior is cooperative.        Thought Content: Thought content normal.        Cognition and Memory: Cognition normal.        Judgment: Judgment normal.     BP 120/72 (BP Location: Left Arm, Patient Position: Sitting, Cuff Size: Normal)   Pulse 74   Temp 97.6 F (36.4 C) (Temporal)   Resp 18   Wt 214 lb 6.4 oz (97.3 kg)   SpO2 97%   BMI 29.08 kg/m  Wt Readings from Last 3 Encounters:  07/23/20 214 lb 6.4 oz (97.3 kg)  12/31/19 214 lb (97.1 kg)  06/26/19 209 lb (94.8 kg)     Health Maintenance Due  Topic Date Due  . Hepatitis C Screening  Never done  . COVID-19 Vaccine (1) Never done  . PNA vac Low Risk Adult (1 of 2 - PCV13) Never done  . TETANUS/TDAP  05/06/2013  . INFLUENZA VACCINE  06/15/2020    There are no preventive care reminders to display for this patient.  Lab Results  Component Value Date   TSH 0.88 06/23/2016   Lab Results  Component Value Date   WBC 6.4 06/29/2018   HGB 14.5 06/29/2018   HCT 42.9 06/29/2018   MCV 91.7 06/29/2018   PLT 182 06/29/2018   Lab Results  Component Value Date   NA 139 06/29/2018   K 4.3 06/29/2018   CO2 28 06/29/2018   GLUCOSE 118 (H) 06/29/2018   BUN 20 06/29/2018   CREATININE 1.04 06/29/2018   BILITOT 0.5 06/23/2016   ALKPHOS 56 06/23/2016   AST 12 06/23/2016   ALT 11 06/23/2016   PROT 6.6 06/23/2016   ALBUMIN 4.0 06/23/2016   CALCIUM 8.7 06/29/2018   No results found for: CHOL No results found for: HDL No results found for: LDLCALC No results found for: TRIG No results found for: CHOLHDL Lab Results  Component Value Date   HGBA1C 5.8 (H) 01/15/2014      Assessment & Plan:    Problem List Items Addressed This Visit    None    Visit Diagnoses    Cough    -  Primary   H/O gastroesophageal reflux (GERD)       Acute rhinitis       Relevant Medications   fluticasone (FLONASE) 50 MCG/ACT nasal spray   Post-nasal drip          Meds ordered this encounter  Medications  . fluticasone (FLONASE) 50 MCG/ACT nasal spray    Sig: Place 2 sprays into both nostrils daily.    Dispense:  16 g    Refill:  6  Pt most likely has allergic rhinitis from yard work and under house dust exposure. Exam positive for Rhinitis. Take Claritin daily, avoid exposures, Flonase RX.   Follow up if you develop new, worsening, or non resolving sxs.   Follow-up: Return if symptoms worsen or fail to improve.    Annie Main, FNP

## 2020-08-06 DIAGNOSIS — L923 Foreign body granuloma of the skin and subcutaneous tissue: Secondary | ICD-10-CM | POA: Diagnosis not present

## 2020-08-18 DIAGNOSIS — R490 Dysphonia: Secondary | ICD-10-CM | POA: Diagnosis not present

## 2020-08-18 DIAGNOSIS — K219 Gastro-esophageal reflux disease without esophagitis: Secondary | ICD-10-CM | POA: Diagnosis not present

## 2020-08-28 ENCOUNTER — Ambulatory Visit (INDEPENDENT_AMBULATORY_CARE_PROVIDER_SITE_OTHER): Payer: Medicare HMO | Admitting: Family Medicine

## 2020-08-28 ENCOUNTER — Other Ambulatory Visit: Payer: Self-pay

## 2020-08-28 VITALS — BP 150/80 | HR 68 | Temp 98.4°F | Ht 72.0 in | Wt 212.0 lb

## 2020-08-28 DIAGNOSIS — R091 Pleurisy: Secondary | ICD-10-CM | POA: Diagnosis not present

## 2020-08-28 NOTE — Progress Notes (Signed)
Subjective:    Patient ID: Howard Davenport, male    DOB: Jan 29, 1946, 74 y.o.   MRN: [id number]  HPI Patient is a pleasant 74 year old Caucasian Howard Davenport who presents today with sudden onset of right-sided chest wall pain, pleurisy, and shortness of breath with activity.  He had Covid about 1 year ago.  Outside of that, he has no risk factors for a DVT or pulmonary embolism.  He states that yesterday he was lifting heavy material and so he may have strained a muscle.  However he felt fine when he went to bed last night.  This morning he awoke with sharp pain in his chest.  If he takes a deep breath, he developed severe right-sided chest pain.  He is unable to take deep inspirations during his pulmonary exam because of the chest pain.  He also reports shortness of breath with minimal activity.  This is out of the normal for this patient.  He even seems winded simply talking to me and he admits as much.  The remainder of his physical exam is normal.  I am unable to reproduce the chest pain with firm palpation on the ribs and on the right side chest wall.  There is no rash to suggest shingles.  His lungs are clear to auscultation there is no crackles to suggest pneumonia.  The pain is certainly in the right chest above the nipple and therefore I do not feel that it is cardiac in nature.  The pain seems out of proportion to simply a pulled muscle and therefore I am very concerned about a pulmonary embolism.  Past Medical History:  Diagnosis Date  . Ascending aortic aneurysm ([place])    4 cm 2019  . Colon polyp    tubular adenoma 01/2018  . GERD (gastroesophageal reflux disease)   . Hiatal hernia   . Inguinal hernia    Past Surgical History:  Procedure Laterality Date  . HERNIA REPAIR     Current Outpatient Medications on File Prior to Visit  Medication Sig Dispense Refill  . fluticasone (FLONASE) 50 MCG/ACT nasal spray Place 2 sprays into both nostrils daily. 16 g 6  . OVER THE COUNTER MEDICATION  Take 1 capsule by mouth daily. Beta Sitosterol supplyment    . Probiotic Product (PROBIOTIC DAILY PO) Take 1 tablet by mouth daily.    . vitamin E 400 UNIT capsule Take 400 Units by mouth daily.     No current facility-administered medications on file prior to visit.   No Known Allergies Social History   Socioeconomic History  . Marital status: Married    Spouse name: Not on file  . Number of children: Not on file  . Years of education: Not on file  . Highest education level: Not on file  Occupational History  . Not on file  Tobacco Use  . Smoking status: Never Smoker  . Smokeless tobacco: Never Used  Substance and Sexual Activity  . Alcohol use: No  . Drug use: No  . Sexual activity: Yes  Other Topics Concern  . Not on file  Social History Narrative   Married with 2 children   Right handed   12 th    3 cups daily   Social Determinants of Health   Financial Resource Strain:   . Difficulty of Paying Living Expenses: Not on file  Food Insecurity:   . Worried About Charity fundraiser in the Last Year: Not on file  . Ran Out of Food  in the Last Year: Not on file  Transportation Needs:   . Lack of Transportation (Medical): Not on file  . Lack of Transportation (Non-Medical): Not on file  Physical Activity:   . Days of Exercise per Week: Not on file  . Minutes of Exercise per Session: Not on file  Stress:   . Feeling of Stress : Not on file  Social Connections:   . Frequency of Communication with Friends and Family: Not on file  . Frequency of Social Gatherings with Friends and Family: Not on file  . Attends Religious Services: Not on file  . Active Member of Clubs or Organizations: Not on file  . Attends Archivist Meetings: Not on file  . Marital Status: Not on file  Intimate Partner Violence:   . Fear of Current or Ex-Partner: Not on file  . Emotionally Abused: Not on file  . Physically Abused: Not on file  . Sexually Abused: Not on file       Review of Systems  All other systems reviewed and are negative.      Objective:   Physical Exam Constitutional:      General: He is not in acute distress.    Appearance: Normal appearance. He is not ill-appearing, toxic-appearing or diaphoretic.  Cardiovascular:     Rate and Rhythm: Normal rate and regular rhythm.     Heart sounds: Normal heart sounds. No murmur heard.  No friction rub. No gallop.   Pulmonary:     Effort: Pulmonary effort is normal. No respiratory distress.     Breath sounds: Normal breath sounds and air entry. No stridor, decreased air movement or transmitted upper airway sounds. No decreased breath sounds, wheezing, rhonchi or rales.  Chest:     Chest wall: No deformity, swelling, tenderness, crepitus or edema. There is no dullness to percussion.    Musculoskeletal:     Right lower leg: No edema.     Left lower leg: No edema.  Lymphadenopathy:     Cervical: No cervical adenopathy.  Neurological:     Mental Status: He is alert.           Assessment & Plan:  Pleurisy - Plan: CBC with Differential/Platelet, COMPLETE METABOLIC PANEL WITH GFR, D-dimer, quantitative (not at [facility])  I have a high pretest probability for a pulmonary embolism.  We discussed going to the emergency room however the patient is comfortable in the exam room today at rest.  He does not appear to be unstable.  Therefore I will check a D-dimer.  Meanwhile I will treat the patient empirically with Eliquis 10 mg twice daily until I have the results of the D-dimer.  If the D-dimer returns positive in the morning, I would arrange a stat CT scan of the chest to evaluate further.  If the D-dimer is negative, I feel we could discontinue the Eliquis and treat the patient for musculoskeletal chest wall pain.  Patient is comfortable with this plan.

## 2020-08-29 ENCOUNTER — Ambulatory Visit (INDEPENDENT_AMBULATORY_CARE_PROVIDER_SITE_OTHER): Payer: Medicare HMO | Admitting: Family Medicine

## 2020-08-29 ENCOUNTER — Ambulatory Visit (HOSPITAL_COMMUNITY)
Admission: RE | Admit: 2020-08-29 | Discharge: 2020-08-29 | Disposition: A | Discharge status: Home / Self Care | Payer: Medicare HMO | Source: Ambulatory Visit | Attending: Family Medicine | Admitting: Family Medicine

## 2020-08-29 ENCOUNTER — Encounter: Payer: Self-pay | Admitting: Family Medicine

## 2020-08-29 ENCOUNTER — Other Ambulatory Visit: Payer: Self-pay | Admitting: Family Medicine

## 2020-08-29 DIAGNOSIS — I2699 Other pulmonary embolism without acute cor pulmonale: Secondary | ICD-10-CM | POA: Diagnosis not present

## 2020-08-29 DIAGNOSIS — R071 Chest pain on breathing: Secondary | ICD-10-CM

## 2020-08-29 DIAGNOSIS — R7989 Other specified abnormal findings of blood chemistry: Secondary | ICD-10-CM

## 2020-08-29 DIAGNOSIS — K449 Diaphragmatic hernia without obstruction or gangrene: Secondary | ICD-10-CM | POA: Diagnosis not present

## 2020-08-29 DIAGNOSIS — R079 Chest pain, unspecified: Secondary | ICD-10-CM | POA: Diagnosis not present

## 2020-08-29 DIAGNOSIS — R911 Solitary pulmonary nodule: Secondary | ICD-10-CM | POA: Diagnosis not present

## 2020-08-29 LAB — COMPLETE METABOLIC PANEL WITH GFR
AG Ratio: 1.5 (calc) (ref 1.0–2.5)
ALT: 9 U/L (ref 9–46)
AST: 11 U/L (ref 10–35)
Albumin: 4.1 g/dL (ref 3.6–5.1)
Alkaline phosphatase (APISO): 71 U/L (ref 35–144)
BUN: 18 mg/dL (ref 7–25)
CO2: 27 mmol/L (ref 20–32)
Calcium: 8.9 mg/dL (ref 8.6–10.3)
Chloride: 102 mmol/L (ref 98–110)
Creat: 1.04 mg/dL (ref 0.70–1.18)
GFR, Est African American: 82 mL/min/{1.73_m2} (ref >=60)
GFR, Est Non African American: 70 mL/min/{1.73_m2} (ref >=60)
Globulin: 2.8 g/dL (calc) (ref 1.9–3.7)
Glucose, Bld: 166 mg/dL — ABNORMAL HIGH (ref 65–99)
Potassium: 4.7 mmol/L (ref 3.5–5.3)
Sodium: 139 mmol/L (ref 135–146)
Total Bilirubin: 0.4 mg/dL (ref 0.2–1.2)
Total Protein: 6.9 g/dL (ref 6.1–8.1)

## 2020-08-29 LAB — CBC WITH DIFFERENTIAL/PLATELET
Absolute Monocytes: 589 cells/uL (ref 200–950)
Basophils Absolute: 28 cells/uL (ref 0–200)
Basophils Relative: 0.4 %
Eosinophils Absolute: 142 cells/uL (ref 15–500)
Eosinophils Relative: 2 %
HCT: 44.5 % (ref 38.5–50.0)
Hemoglobin: 15 g/dL (ref 13.2–17.1)
Lymphs Abs: 2698 cells/uL (ref 850–3900)
MCH: 31 pg (ref 27.0–33.0)
MCHC: 33.7 g/dL (ref 32.0–36.0)
MCV: 91.9 fL (ref 80.0–100.0)
MPV: 10.4 fL (ref 7.5–12.5)
Monocytes Relative: 8.3 %
Neutro Abs: 3642 cells/uL (ref 1500–7800)
Neutrophils Relative %: 51.3 %
Platelets: 193 10*3/uL (ref 140–400)
RBC: 4.84 10*6/uL (ref 4.20–5.80)
RDW: 12.5 % (ref 11.0–15.0)
Total Lymphocyte: 38 %
WBC: 7.1 10*3/uL (ref 3.8–10.8)

## 2020-08-29 LAB — D-DIMER, QUANTITATIVE: D-Dimer, Quant: 1.65 mcg/mL FEU — ABNORMAL HIGH (ref <0.50)

## 2020-08-29 MED ORDER — IOHEXOL 350 MG/ML SOLN
70.0000 mL | Freq: Once | INTRAVENOUS | Status: AC | PRN
  Administered 2020-08-29: 70 mL via INTRAVENOUS

## 2020-08-29 NOTE — Progress Notes (Signed)
Subjective:    Patient ID: Howard Davenport, male    DOB: Jul 14, 1946, 74 y.o.   MRN: [id number]  HPI  08/28/20 Patient is a pleasant 74 year old Caucasian gentleman who presents today with sudden onset of right-sided chest wall pain, pleurisy, and shortness of breath with activity.  He had Covid about 1 year ago.  Outside of that, he has no risk factors for a DVT or pulmonary embolism.  He states that yesterday he was lifting heavy material and so he may have strained a muscle.  However he felt fine when he went to bed last night.  This morning he awoke with sharp pain in his chest.  If he takes a deep breath, he developed severe right-sided chest pain.  He is unable to take deep inspirations during his pulmonary exam because of the chest pain.  He also reports shortness of breath with minimal activity.  This is out of the normal for this patient.  He even seems winded simply talking to me and he admits as much.  The remainder of his physical exam is normal.  I am unable to reproduce the chest pain with firm palpation on the ribs and on the right side chest wall.  There is no rash to suggest shingles.  His lungs are clear to auscultation there is no crackles to suggest pneumonia.  The pain is certainly in the right chest above the nipple and therefore I do not feel that it is cardiac in nature.  The pain seems out of proportion to simply a pulled muscle and therefore I am very concerned about a pulmonary embolism.  At that time, my plan was: I have a high pretest probability for a pulmonary embolism.  We discussed going to the emergency room however the patient is comfortable in the exam room today at rest.  He does not appear to be unstable.  Therefore I will check a D-dimer.  Meanwhile I will treat the patient empirically with Eliquis 10 mg twice daily until I have the results of the D-dimer.  If the D-dimer returns positive in the morning, I would arrange a stat CT scan of the chest to evaluate further.  If  the D-dimer is negative, I feel we could discontinue the Eliquis and treat the patient for musculoskeletal chest wall pain.  Patient is comfortable with this plan.  08/29/20 IMPRESSION: 1. Study is positive for lobar, segmental and subsegmental sized emboli in the lungs bilaterally. Although the majority of these appear nonocclusive, there is at least one occlusive segmental sized embolus to the right upper lobe which is associated with an area of pulmonary infarction complicated by small amount of alveolar hemorrhage, as detailed above. 2. Aortic atherosclerosis, in addition to 2 vessel coronary artery disease. Assessment for potential risk factor modification, dietary therapy or pharmacologic therapy may be warranted, if clinically indicated. 3. 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 4. Multiple small pulmonary nodules measuring 3 mm or less in size, stable compared to prior examinations dating back to 07/14/2018, considered definitively benign in need of no future imaging follow-up.  Patient is here today to discuss.  He states that he is already feeling better despite only taking 2 doses thus far of Eliquis.  He states that the pain has improved and his shortness of breath is slightly better.  He denies any frank mopped assist.  Chest pain is gradually improving.  I reviewed the CT scan findings with the patient.  We discussed the options including going to the hospital versus outpatient treatment.  Clinically the patient appears stable for outpatient treatment.  I reached out to a local pulmonologist, Dr. Silas Flood.  He was kind enough to review the case with me and agreed with outpatient treatment. Past Medical History:  Diagnosis Date  . Ascending aortic aneurysm ([place])    4 cm 2019  . Colon polyp    tubular adenoma 01/2018  . GERD (gastroesophageal reflux disease)   . Hiatal hernia   . Inguinal hernia    Past Surgical History:   Procedure Laterality Date  . HERNIA REPAIR     Current Outpatient Medications on File Prior to Visit  Medication Sig Dispense Refill  . fluticasone (FLONASE) 50 MCG/ACT nasal spray Place 2 sprays into both nostrils daily. 16 g 6  . OVER THE COUNTER MEDICATION Take 1 capsule by mouth daily. Beta Sitosterol supplyment    . Probiotic Product (PROBIOTIC DAILY PO) Take 1 tablet by mouth daily.    . vitamin E 400 UNIT capsule Take 400 Units by mouth daily.     No current facility-administered medications on file prior to visit.   No Known Allergies Social History   Socioeconomic History  . Marital status: Married    Spouse name: Not on file  . Number of children: Not on file  . Years of education: Not on file  . Highest education level: Not on file  Occupational History  . Not on file  Tobacco Use  . Smoking status: Never Smoker  . Smokeless tobacco: Never Used  Substance and Sexual Activity  . Alcohol use: No  . Drug use: No  . Sexual activity: Yes  Other Topics Concern  . Not on file  Social History Narrative   Married with 2 children   Right handed   12 th    3 cups daily   Social Determinants of Health   Financial Resource Strain:   . Difficulty of Paying Living Expenses: Not on file  Food Insecurity:   . Worried About Charity fundraiser in the Last Year: Not on file  . Ran Out of Food in the Last Year: Not on file  Transportation Needs:   . Lack of Transportation (Medical): Not on file  . Lack of Transportation (Non-Medical): Not on file  Physical Activity:   . Days of Exercise per Week: Not on file  . Minutes of Exercise per Session: Not on file  Stress:   . Feeling of Stress : Not on file  Social Connections:   . Frequency of Communication with Friends and Family: Not on file  . Frequency of Social Gatherings with Friends and Family: Not on file  . Attends Religious Services: Not on file  . Active Member of Clubs or Organizations: Not on file  . Attends  Archivist Meetings: Not on file  . Marital Status: Not on file  Intimate Partner Violence:   . Fear of Current or Ex-Partner: Not on file  . Emotionally Abused: Not on file  . Physically Abused: Not on file  . Sexually Abused: Not on file      Review of Systems  All other systems reviewed and are negative.      Objective:   Physical Exam Constitutional:      General: He is not in acute distress.    Appearance: Normal appearance. He is not ill-appearing, toxic-appearing or diaphoretic.  Cardiovascular:     Rate and Rhythm: Normal rate  and regular rhythm.     Heart sounds: Normal heart sounds. No murmur heard.  No friction rub. No gallop.   Pulmonary:     Effort: Pulmonary effort is normal. No respiratory distress.     Breath sounds: Normal breath sounds and air entry. No stridor, decreased air movement or transmitted upper airway sounds. No decreased breath sounds, wheezing, rhonchi or rales.  Chest:     Chest wall: No deformity, swelling, tenderness, crepitus or edema. There is no dullness to percussion.    Musculoskeletal:     Right lower leg: No edema.     Left lower leg: No edema.  Lymphadenopathy:     Cervical: No cervical adenopathy.  Neurological:     Mental Status: He is alert.           Assessment & Plan:  Pulmonary embolism and infarction [facility])  Patient will continue Eliquis 10 mg twice daily for a total of 7 days and then switch to 5 mg twice daily for a total of 6 months thereafter.  I have asked to recheck the patient next week and at that time I will check a CBC to monitor for any drop in his hemoglobin.  If he develops frank hemoptysis or worsening chest pain or worsening shortness of breath I recommended going to the hospital.  Patient is comfortable with this plan.

## 2020-09-02 ENCOUNTER — Other Ambulatory Visit: Payer: Self-pay

## 2020-09-02 ENCOUNTER — Ambulatory Visit (INDEPENDENT_AMBULATORY_CARE_PROVIDER_SITE_OTHER): Payer: Medicare HMO | Admitting: Family Medicine

## 2020-09-02 ENCOUNTER — Encounter: Payer: Self-pay | Admitting: Family Medicine

## 2020-09-02 VITALS — BP 122/64 | HR 64 | Temp 98.1°F | Ht 71.0 in | Wt 215.0 lb

## 2020-09-02 DIAGNOSIS — I2699 Other pulmonary embolism without acute cor pulmonale: Secondary | ICD-10-CM

## 2020-09-02 LAB — CBC WITH DIFFERENTIAL/PLATELET
Absolute Monocytes: 552 cells/uL (ref 200–950)
Basophils Absolute: 41 cells/uL (ref 0–200)
Basophils Relative: 0.6 %
Eosinophils Absolute: 138 cells/uL (ref 15–500)
Eosinophils Relative: 2 %
HCT: 44.9 % (ref 38.5–50.0)
Hemoglobin: 15.3 g/dL (ref 13.2–17.1)
Lymphs Abs: 2325 cells/uL (ref 850–3900)
MCH: 31.4 pg (ref 27.0–33.0)
MCHC: 34.1 g/dL (ref 32.0–36.0)
MCV: 92 fL (ref 80.0–100.0)
MPV: 9.8 fL (ref 7.5–12.5)
Monocytes Relative: 8 %
Neutro Abs: 3843 cells/uL (ref 1500–7800)
Neutrophils Relative %: 55.7 %
Platelets: 242 10*3/uL (ref 140–400)
RBC: 4.88 10*6/uL (ref 4.20–5.80)
RDW: 12.9 % (ref 11.0–15.0)
Total Lymphocyte: 33.7 %
WBC: 6.9 10*3/uL (ref 3.8–10.8)

## 2020-09-02 MED ORDER — APIXABAN 5 MG PO TABS: 5.0000 mg | ORAL_TABLET | Freq: Two times a day (BID) | ORAL | 4 refills | Status: DC

## 2020-09-02 NOTE — Patient Instructions (Signed)
° ° ° °  If you have lab work done today you will be contacted with your lab results within the next 2 weeks.  If you have not heard from us then please contact us. The fastest way to get your results is to register for My Chart. ° ° °IF you received an x-ray today, you will receive an invoice from [facility]. Please contact [facility] at [phone] with questions or concerns regarding your invoice.  ° °IF you received labwork today, you will receive an invoice from [facility]. Please contact [facility] at [phone] with questions or concerns regarding your invoice.  ° °Our billing staff will not be able to assist you with questions regarding bills from these companies. ° °You will be contacted with the lab results as soon as they are available. The fastest way to get your results is to activate your My Chart account. Instructions are located on the last page of this paperwork. If you have not heard from us regarding the results in 2 weeks, please contact this office. °  ° ° ° °

## 2020-09-02 NOTE — Progress Notes (Signed)
Subjective:    Patient ID: Howard Davenport, male    DOB: 03-19-1946, 74 y.o.   MRN: [id number]  HPI  08/28/20 Patient is a pleasant 74 year old Caucasian gentleman who presents today with sudden onset of right-sided chest wall pain, pleurisy, and shortness of breath with activity.  He had Covid about 1 year ago.  Outside of that, he has no risk factors for a DVT or pulmonary embolism.  He states that yesterday he was lifting heavy material and so he may have strained a muscle.  However he felt fine when he went to bed last night.  This morning he awoke with sharp pain in his chest.  If he takes a deep breath, he developed severe right-sided chest pain.  He is unable to take deep inspirations during his pulmonary exam because of the chest pain.  He also reports shortness of breath with minimal activity.  This is out of the normal for this patient.  He even seems winded simply talking to me and he admits as much.  The remainder of his physical exam is normal.  I am unable to reproduce the chest pain with firm palpation on the ribs and on the right side chest wall.  There is no rash to suggest shingles.  His lungs are clear to auscultation there is no crackles to suggest pneumonia.  The pain is certainly in the right chest above the nipple and therefore I do not feel that it is cardiac in nature.  The pain seems out of proportion to simply a pulled muscle and therefore I am very concerned about a pulmonary embolism.  At that time, my plan was: I have a high pretest probability for a pulmonary embolism.  We discussed going to the emergency room however the patient is comfortable in the exam room today at rest.  He does not appear to be unstable.  Therefore I will check a D-dimer.  Meanwhile I will treat the patient empirically with Eliquis 10 mg twice daily until I have the results of the D-dimer.  If the D-dimer returns positive in the morning, I would arrange a stat CT scan of the chest to evaluate further.  If  the D-dimer is negative, I feel we could discontinue the Eliquis and treat the patient for musculoskeletal chest wall pain.  Patient is comfortable with this plan.  08/29/20 IMPRESSION: 1. Study is positive for lobar, segmental and subsegmental sized emboli in the lungs bilaterally. Although the majority of these appear nonocclusive, there is at least one occlusive segmental sized embolus to the right upper lobe which is associated with an area of pulmonary infarction complicated by small amount of alveolar hemorrhage, as detailed above. 2. Aortic atherosclerosis, in addition to 2 vessel coronary artery disease. Assessment for potential risk factor modification, dietary therapy or pharmacologic therapy may be warranted, if clinically indicated. 3. 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 4. Multiple small pulmonary nodules measuring 3 mm or less in size, stable compared to prior examinations dating back to 07/14/2018, considered definitively benign in need of no future imaging follow-up.  Patient is here today to discuss.  He states that he is already feeling better despite only taking 2 doses thus far of Eliquis.  He states that the pain has improved and his shortness of breath is slightly better.  He denies any frank mopped assist.  Chest pain is gradually improving.  I reviewed the CT scan findings with the patient.  We discussed the options including going to the hospital versus outpatient treatment.  Clinically the patient appears stable for outpatient treatment.  I reached out to a local pulmonologist, Dr. Silas Flood.  He was kind enough to review the case with me and agreed with outpatient treatment.  At that time, my plan was: Patient will continue Eliquis 10 mg twice daily for a total of 7 days and then switch to 5 mg twice daily for a total of 6 months thereafter.  I have asked to recheck the patient next week and at that time I will check a  CBC to monitor for any drop in his hemoglobin.  If he develops frank hemoptysis or worsening chest pain or worsening shortness of breath I recommended going to the hospital.  Patient is comfortable with this plan.  09/02/20 Patient's breathing has improved dramatically.  He states that he can barely feel the pain in the right side of his chest.  He is no longer having pain with deep inspiration.  His shortness of breath has improved markedly from last week.  He denies any frank hemoptysis.  He denies any melena or hematochezia.  He denies any epistaxis.  He denies any hematemesis. Past Medical History:  Diagnosis Date   Ascending aortic aneurysm ([place])    4 cm 2019   Colon polyp    tubular adenoma 01/2018   GERD (gastroesophageal reflux disease)    Hiatal hernia    Inguinal hernia    Pulmonary emboli [facility])    Past Surgical History:  Procedure Laterality Date   HERNIA REPAIR     Current Outpatient Medications on File Prior to Visit  Medication Sig Dispense Refill   fluticasone (FLONASE) 50 MCG/ACT nasal spray Place 2 sprays into both nostrils daily. 16 g 6   OVER THE COUNTER MEDICATION Take 1 capsule by mouth daily. Beta Sitosterol supplyment     Probiotic Product (PROBIOTIC DAILY PO) Take 1 tablet by mouth daily.     vitamin E 400 UNIT capsule Take 400 Units by mouth daily.     No current facility-administered medications on file prior to visit.   No Known Allergies Social History   Socioeconomic History   Marital status: Married    Spouse name: Not on file   Number of children: Not on file   Years of education: Not on file   Highest education level: Not on file  Occupational History   Not on file  Tobacco Use   Smoking status: Never Smoker   Smokeless tobacco: Never Used  Substance and Sexual Activity   Alcohol use: No   Drug use: No   Sexual activity: Yes  Other Topics Concern   Not on file  Social History Narrative   Married with 2 children    Right handed   12 th    3 cups daily   Social Determinants of Health   Financial Resource Strain:    Difficulty of Paying Living Expenses: Not on file  Food Insecurity:    Worried About Charity fundraiser in the Last Year: Not on file   [employer] of Food in the Last Year: Not on file  Transportation Needs:    Lack of Transportation (Medical): Not on file   Lack of Transportation (Non-Medical): Not on file  Physical Activity:    Days of Exercise per Week: Not on file   Minutes of Exercise per Session: Not on file  Stress:    Feeling of Stress : Not on  file  Social Connections:    Frequency of Communication with Friends and Family: Not on file   Frequency of Social Gatherings with Friends and Family: Not on file   Attends Religious Services: Not on file   Active Member of Clubs or Organizations: Not on file   Attends Archivist Meetings: Not on file   Marital Status: Not on file  Intimate Partner Violence:    Fear of Current or Ex-Partner: Not on file   Emotionally Abused: Not on file   Physically Abused: Not on file   Sexually Abused: Not on file      Review of Systems  All other systems reviewed and are negative.      Objective:   Physical Exam Constitutional:      General: He is not in acute distress.    Appearance: Normal appearance. He is not ill-appearing, toxic-appearing or diaphoretic.  Cardiovascular:     Rate and Rhythm: Normal rate and regular rhythm.     Heart sounds: Normal heart sounds. No murmur heard.  No friction rub. No gallop.   Pulmonary:     Effort: Pulmonary effort is normal. No respiratory distress.     Breath sounds: Normal breath sounds and air entry. No stridor, decreased air movement or transmitted upper airway sounds. No decreased breath sounds, wheezing, rhonchi or rales.  Chest:     Chest wall: No deformity, swelling, tenderness, crepitus or edema. There is no dullness to percussion.  Musculoskeletal:      Right lower leg: No edema.     Left lower leg: No edema.  Lymphadenopathy:     Cervical: No cervical adenopathy.  Neurological:     Mental Status: He is alert.           Assessment & Plan:  Pulmonary embolism and infarction [facility]) - Plan: CBC with Differential/Platelet  Clinically the patient is doing much better.  I believe that the patient likely had his PE due to his Covid infection and that he very well could have been caring this for quite some time given the fact infarction was seen on his CT scan.  I have recommended 6 months total of Eliquis.  He will complete a total 7-day course of 10 mg twice daily and then transition to 5 mg twice daily to complete 6 months total.  Check a CBC today to evaluate for any evidence of an occult blood loss.  Otherwise continue the medication for a total of 6 months.

## 2020-11-19 ENCOUNTER — Telehealth: Payer: Self-pay | Admitting: Family Medicine

## 2020-11-19 MED ORDER — APIXABAN 5 MG PO TABS
5.0000 mg | ORAL_TABLET | Freq: Two times a day (BID) | ORAL | 4 refills | Status: DC
Start: 2020-11-19 — End: 2021-03-02

## 2020-11-19 NOTE — Telephone Encounter (Signed)
Prescription printed and awaiting signature.  

## 2020-11-19 NOTE — Telephone Encounter (Signed)
Patient requesting we send a prescription of his eliquiss to the [place]- please fax to [phone]. Patient would like a phone call to let him know this has been done.   CB # [phone]

## 2020-11-24 DIAGNOSIS — N401 Enlarged prostate with lower urinary tract symptoms: Secondary | ICD-10-CM | POA: Diagnosis not present

## 2020-11-24 DIAGNOSIS — N5201 Erectile dysfunction due to arterial insufficiency: Secondary | ICD-10-CM | POA: Diagnosis not present

## 2020-11-24 DIAGNOSIS — R3912 Poor urinary stream: Secondary | ICD-10-CM | POA: Diagnosis not present

## 2020-12-11 DIAGNOSIS — N401 Enlarged prostate with lower urinary tract symptoms: Secondary | ICD-10-CM | POA: Diagnosis not present

## 2020-12-11 DIAGNOSIS — N139 Obstructive and reflux uropathy, unspecified: Secondary | ICD-10-CM | POA: Diagnosis not present

## 2020-12-23 ENCOUNTER — Other Ambulatory Visit: Payer: Self-pay

## 2020-12-23 ENCOUNTER — Ambulatory Visit (INDEPENDENT_AMBULATORY_CARE_PROVIDER_SITE_OTHER): Payer: Medicare HMO | Admitting: Family Medicine

## 2020-12-23 VITALS — BP 138/78 | HR 59 | Temp 97.7°F | Resp 15 | Ht 71.0 in | Wt 219.0 lb

## 2020-12-23 DIAGNOSIS — I2699 Other pulmonary embolism without acute cor pulmonale: Secondary | ICD-10-CM

## 2020-12-23 DIAGNOSIS — I7121 Aneurysm of the ascending aorta, without rupture: Secondary | ICD-10-CM

## 2020-12-23 DIAGNOSIS — N139 Obstructive and reflux uropathy, unspecified: Secondary | ICD-10-CM | POA: Diagnosis not present

## 2020-12-23 DIAGNOSIS — I712 Thoracic aortic aneurysm, without rupture: Secondary | ICD-10-CM | POA: Diagnosis not present

## 2020-12-23 DIAGNOSIS — Z136 Encounter for screening for cardiovascular disorders: Secondary | ICD-10-CM | POA: Diagnosis not present

## 2020-12-23 DIAGNOSIS — R739 Hyperglycemia, unspecified: Secondary | ICD-10-CM

## 2020-12-23 DIAGNOSIS — Z13220 Encounter for screening for lipoid disorders: Secondary | ICD-10-CM | POA: Diagnosis not present

## 2020-12-23 MED ORDER — OMEPRAZOLE 20 MG PO CPDR: 20.0000 mg | DELAYED_RELEASE_CAPSULE | Freq: Every day | ORAL | 3 refills | Status: DC

## 2020-12-23 NOTE — Patient Instructions (Signed)
° ° ° °  If you have lab work done today you will be contacted with your lab results within the next 2 weeks.  If you have not heard from us then please contact us. The fastest way to get your results is to register for My Chart. ° ° °IF you received an x-ray today, you will receive an invoice from [facility]. Please contact [facility] at [phone] with questions or concerns regarding your invoice.  ° °IF you received labwork today, you will receive an invoice from [facility]. Please contact [facility] at [phone] with questions or concerns regarding your invoice.  ° °Our billing staff will not be able to assist you with questions regarding bills from these companies. ° °You will be contacted with the lab results as soon as they are available. The fastest way to get your results is to activate your My Chart account. Instructions are located on the last page of this paperwork. If you have not heard from us regarding the results in 2 weeks, please contact this office. °  ° ° ° °

## 2020-12-23 NOTE — Progress Notes (Signed)
Subjective:    Patient ID: Howard Davenport, male    DOB: Jul 14, 1946, 75 y.o.   MRN: [id number]  HPI  08/28/20 Patient is a pleasant 75 year old Caucasian gentleman who presents today with sudden onset of right-sided chest wall pain, pleurisy, and shortness of breath with activity.  He had Covid about 1 year ago.  Outside of that, he has no risk factors for a DVT or pulmonary embolism.  He states that yesterday he was lifting heavy material and so he may have strained a muscle.  However he felt fine when he went to bed last night.  This morning he awoke with sharp pain in his chest.  If he takes a deep breath, he developed severe right-sided chest pain.  He is unable to take deep inspirations during his pulmonary exam because of the chest pain.  He also reports shortness of breath with minimal activity.  This is out of the normal for this patient.  He even seems winded simply talking to me and he admits as much.  The remainder of his physical exam is normal.  I am unable to reproduce the chest pain with firm palpation on the ribs and on the right side chest wall.  There is no rash to suggest shingles.  His lungs are clear to auscultation there is no crackles to suggest pneumonia.  The pain is certainly in the right chest above the nipple and therefore I do not feel that it is cardiac in nature.  The pain seems out of proportion to simply a pulled muscle and therefore I am very concerned about a pulmonary embolism.  At that time, my plan was: I have a high pretest probability for a pulmonary embolism.  We discussed going to the emergency room however the patient is comfortable in the exam room today at rest.  He does not appear to be unstable.  Therefore I will check a D-dimer.  Meanwhile I will treat the patient empirically with Eliquis 10 mg twice daily until I have the results of the D-dimer.  If the D-dimer returns positive in the morning, I would arrange a stat CT scan of the chest to evaluate further.  If  the D-dimer is negative, I feel we could discontinue the Eliquis and treat the patient for musculoskeletal chest wall pain.  Patient is comfortable with this plan.  08/29/20 IMPRESSION: 1. Study is positive for lobar, segmental and subsegmental sized emboli in the lungs bilaterally. Although the majority of these appear nonocclusive, there is at least one occlusive segmental sized embolus to the right upper lobe which is associated with an area of pulmonary infarction complicated by small amount of alveolar hemorrhage, as detailed above. 2. Aortic atherosclerosis, in addition to 2 vessel coronary artery disease. Assessment for potential risk factor modification, dietary therapy or pharmacologic therapy may be warranted, if clinically indicated. 3. 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 4. Multiple small pulmonary nodules measuring 3 mm or less in size, stable compared to prior examinations dating back to 07/14/2018, considered definitively benign in need of no future imaging follow-up.  Patient is here today to discuss.  He states that he is already feeling better despite only taking 2 doses thus far of Eliquis.  He states that the pain has improved and his shortness of breath is slightly better.  He denies any frank mopped assist.  Chest pain is gradually improving.  I reviewed the CT scan findings with the patient.  We discussed the options including going to the hospital versus outpatient treatment.  Clinically the patient appears stable for outpatient treatment.  I reached out to a local pulmonologist, Dr. Silas Flood.  He was kind enough to review the case with me and agreed with outpatient treatment.  At that time, my plan was: Patient will continue Eliquis 10 mg twice daily for a total of 7 days and then switch to 5 mg twice daily for a total of 6 months thereafter.  I have asked to recheck the patient next week and at that time I will check a  CBC to monitor for any drop in his hemoglobin.  If he develops frank hemoptysis or worsening chest pain or worsening shortness of breath I recommended going to the hospital.  Patient is comfortable with this plan.  09/02/20 Patient's breathing has improved dramatically.  He states that he can barely feel the pain in the right side of his chest.  He is no longer having pain with deep inspiration.  His shortness of breath has improved markedly from last week.  He denies any frank hemoptysis.  He denies any melena or hematochezia.  He denies any epistaxis.  He denies any hematemesis.  At that time, my plan was: Clinically the patient is doing much better.  I believe that the patient likely had his PE due to his Covid infection and that he very well could have been caring this for quite some time given the fact infarction was seen on his CT scan.  I have recommended 6 months total of Eliquis.  He will complete a total 7-day course of 10 mg twice daily and then transition to 5 mg twice daily to complete 6 months total.  Check a CBC today to evaluate for any evidence of an occult blood loss.  Otherwise continue the medication for a total of 6 months.  12/23/20 Patient is a very pleasant 75 year old gentleman who comes in today for recheck.  He has a history of prediabetes and an elevated blood sugar.  He is due to recheck his A1c along with his cholesterol.  Overall he has been doing well on the Eliquis.  He denies any bleeding or bruising.  He does have a history of ascending aortic aneurysm.  This was last checked on his CT scan in October 2021.  He is due to recheck that again in October 2022.  He would like me to go ahead and schedule that.  His blood pressure today is excellent.  He denies any chest pain or shortness of breath or dyspnea on exertion  Past Medical History:  Diagnosis Date  . Ascending aortic aneurysm ([place])    4 cm 2019  . Colon polyp    tubular adenoma 01/2018  . GERD (gastroesophageal  reflux disease)   . Hiatal hernia   . Inguinal hernia   . Pulmonary emboli ([place])   . Pulmonary emboli [facility])    Past Surgical History:  Procedure Laterality Date  . HERNIA REPAIR     Current Outpatient Medications on File Prior to Visit  Medication Sig Dispense Refill  . apixaban (ELIQUIS) 5 MG TABS tablet Take 1 tablet (5 mg total) by mouth 2 (two) times daily. 60 tablet 4  . fluticasone (FLONASE) 50 MCG/ACT nasal spray Place 2 sprays into both nostrils daily. 16 g 6  . OVER THE COUNTER MEDICATION Take 1 capsule by mouth daily. Beta Sitosterol supplyment    . Probiotic Product (PROBIOTIC DAILY PO) Take 1 tablet by  mouth daily.    . vitamin E 400 UNIT capsule Take 400 Units by mouth daily.     No current facility-administered medications on file prior to visit.   No Known Allergies Social History   Socioeconomic History  . Marital status: Married    Spouse name: Not on file  . Number of children: Not on file  . Years of education: Not on file  . Highest education level: Not on file  Occupational History  . Not on file  Tobacco Use  . Smoking status: Never Smoker  . Smokeless tobacco: Never Used  Substance and Sexual Activity  . Alcohol use: No  . Drug use: No  . Sexual activity: Yes  Other Topics Concern  . Not on file  Social History Narrative   Married with 2 children   Right handed   12 th    3 cups daily   Social Determinants of Health   Financial Resource Strain: Not on file  Food Insecurity: Not on file  Transportation Needs: Not on file  Physical Activity: Not on file  Stress: Not on file  Social Connections: Not on file  Intimate Partner Violence: Not on file      Review of Systems  All other systems reviewed and are negative.      Objective:   Physical Exam Constitutional:      General: He is not in acute distress.    Appearance: Normal appearance. He is not ill-appearing, toxic-appearing or diaphoretic.  Cardiovascular:     Rate and  Rhythm: Normal rate and regular rhythm.     Heart sounds: Normal heart sounds. No murmur heard. No friction rub. No gallop.   Pulmonary:     Effort: Pulmonary effort is normal. No respiratory distress.     Breath sounds: Normal breath sounds and air entry. No stridor, decreased air movement or transmitted upper airway sounds. No decreased breath sounds, wheezing, rhonchi or rales.  Chest:     Chest wall: No deformity, swelling, tenderness, crepitus or edema. There is no dullness to percussion.  Musculoskeletal:     Right lower leg: No edema.     Left lower leg: No edema.  Lymphadenopathy:     Cervical: No cervical adenopathy.  Neurological:     Mental Status: He is alert.           Assessment & Plan:  Pulmonary embolism and infarction (HCC)  Elevated blood sugar - Plan: Hemoglobin A1c, CBC with Differential/Platelet, COMPLETE METABOLIC PANEL WITH GFR, Lipid panel  Ascending aortic aneurysm (HCC)  Overall the patient is doing well.  He will finish his Eliquis in April.  Check CBC, CMP, lipid panel, and A1c due to his history of prediabetes.  Ideally I like his A1c to be below 6.5 and his LDL cholesterol to be below 100.  I will also schedule the patient for a follow-up CT scan to evaluate the a sending aortic aneurysm for October of this year.  Continue to try to encourage the patient to get his Covid shot.

## 2020-12-24 LAB — COMPLETE METABOLIC PANEL WITH GFR
AG Ratio: 1.6 (calc) (ref 1.0–2.5)
ALT: 11 U/L (ref 9–46)
AST: 12 U/L (ref 10–35)
Albumin: 4.2 g/dL (ref 3.6–5.1)
Alkaline phosphatase (APISO): 57 U/L (ref 35–144)
BUN: 17 mg/dL (ref 7–25)
CO2: 31 mmol/L (ref 20–32)
Calcium: 9.3 mg/dL (ref 8.6–10.3)
Chloride: 102 mmol/L (ref 98–110)
Creat: 0.93 mg/dL (ref 0.70–1.18)
GFR, Est African American: 93 mL/min/{1.73_m2} (ref >=60)
GFR, Est Non African American: 81 mL/min/{1.73_m2} (ref >=60)
Globulin: 2.7 g/dL (calc) (ref 1.9–3.7)
Glucose, Bld: 106 mg/dL — ABNORMAL HIGH (ref 65–99)
Potassium: 5.1 mmol/L (ref 3.5–5.3)
Sodium: 138 mmol/L (ref 135–146)
Total Bilirubin: 0.6 mg/dL (ref 0.2–1.2)
Total Protein: 6.9 g/dL (ref 6.1–8.1)

## 2020-12-24 LAB — CBC WITH DIFFERENTIAL/PLATELET
Absolute Monocytes: 592 cells/uL (ref 200–950)
Basophils Absolute: 41 cells/uL (ref 0–200)
Basophils Relative: 0.7 %
Eosinophils Absolute: 180 cells/uL (ref 15–500)
Eosinophils Relative: 3.1 %
HCT: 46.6 % (ref 38.5–50.0)
Hemoglobin: 15.8 g/dL (ref 13.2–17.1)
Lymphs Abs: 2320 cells/uL (ref 850–3900)
MCH: 30.9 pg (ref 27.0–33.0)
MCHC: 33.9 g/dL (ref 32.0–36.0)
MCV: 91.2 fL (ref 80.0–100.0)
MPV: 10 fL (ref 7.5–12.5)
Monocytes Relative: 10.2 %
Neutro Abs: 2668 cells/uL (ref 1500–7800)
Neutrophils Relative %: 46 %
Platelets: 188 10*3/uL (ref 140–400)
RBC: 5.11 10*6/uL (ref 4.20–5.80)
RDW: 13 % (ref 11.0–15.0)
Total Lymphocyte: 40 %
WBC: 5.8 10*3/uL (ref 3.8–10.8)

## 2020-12-24 LAB — LIPID PANEL
Cholesterol: 198 mg/dL (ref <200)
HDL: 51 mg/dL (ref >=40)
LDL Cholesterol (Calc): 114 mg/dL (calc) — ABNORMAL HIGH
Non-HDL Cholesterol (Calc): 147 mg/dL (calc) — ABNORMAL HIGH (ref <130)
Total CHOL/HDL Ratio: 3.9 (calc) (ref <5.0)
Triglycerides: 209 mg/dL — ABNORMAL HIGH (ref <150)

## 2020-12-24 LAB — HEMOGLOBIN A1C
Hgb A1c MFr Bld: 6.1 % of total Hgb — ABNORMAL HIGH (ref <5.7)
Mean Plasma Glucose: 128 mg/dL
eAG (mmol/L): 7.1 mmol/L

## 2020-12-27 DIAGNOSIS — U071 COVID-19: Secondary | ICD-10-CM | POA: Diagnosis not present

## 2020-12-27 DIAGNOSIS — Z20822 Contact with and (suspected) exposure to covid-19: Secondary | ICD-10-CM | POA: Diagnosis not present

## 2021-01-14 DIAGNOSIS — H52223 Regular astigmatism, bilateral: Secondary | ICD-10-CM | POA: Diagnosis not present

## 2021-01-14 DIAGNOSIS — H524 Presbyopia: Secondary | ICD-10-CM | POA: Diagnosis not present

## 2021-01-14 DIAGNOSIS — H5203 Hypermetropia, bilateral: Secondary | ICD-10-CM | POA: Diagnosis not present

## 2021-02-13 ENCOUNTER — Encounter: Payer: Self-pay | Admitting: Family Medicine

## 2021-02-13 ENCOUNTER — Other Ambulatory Visit: Payer: Self-pay

## 2021-02-13 ENCOUNTER — Ambulatory Visit (INDEPENDENT_AMBULATORY_CARE_PROVIDER_SITE_OTHER): Payer: Medicare HMO | Admitting: Family Medicine

## 2021-02-13 ENCOUNTER — Ambulatory Visit (INDEPENDENT_AMBULATORY_CARE_PROVIDER_SITE_OTHER): Payer: Medicare HMO | Admitting: Nurse Practitioner

## 2021-02-13 VITALS — BP 120/80 | HR 60 | Temp 97.7°F | Ht 71.0 in | Wt 217.6 lb

## 2021-02-13 DIAGNOSIS — J4521 Mild intermittent asthma with (acute) exacerbation: Secondary | ICD-10-CM | POA: Diagnosis not present

## 2021-02-13 DIAGNOSIS — S41101A Unspecified open wound of right upper arm, initial encounter: Secondary | ICD-10-CM

## 2021-02-13 DIAGNOSIS — S71101A Unspecified open wound, right thigh, initial encounter: Secondary | ICD-10-CM

## 2021-02-13 DIAGNOSIS — W540XXA Bitten by dog, initial encounter: Secondary | ICD-10-CM

## 2021-02-13 MED ORDER — AMOXICILLIN-POT CLAVULANATE 875-125 MG PO TABS: 1.0000 | ORAL_TABLET | Freq: Two times a day (BID) | ORAL | 0 refills | Status: AC

## 2021-02-13 MED ORDER — ALBUTEROL SULFATE HFA 108 (90 BASE) MCG/ACT IN AERS: 2.0000 | INHALATION_SPRAY | Freq: Four times a day (QID) | RESPIRATORY_TRACT | 0 refills | Status: DC | PRN

## 2021-02-13 MED ORDER — PREDNISONE 20 MG PO TABS: ORAL_TABLET | ORAL | 0 refills | Status: DC

## 2021-02-13 NOTE — Progress Notes (Signed)
Subjective:    Patient ID: Howard Davenport, male    DOB: January 26, 1946, 75 y.o.   MRN: [id number]  HPI: Howard Davenport is a 75 y.o. male presenting for dog bite.  Chief Complaint  Patient presents with  . Animal Bite   Patient reports today after he left our office after seeing PCP, he stopped by someone's home who had a dog on a chain.  He went up to the dog and was attacked and bitten by the dog in numerous areas.  The owner called the dog off and when asked if the dog was up-to-date on its shots, patient reports that the owner said the dog was not up to date on his rabies shot.   ANIMAL BITE Duration: HOURS Location: right upper arm, groin History of trauma in area: yes; dog bite Pain: yes Quality: sore Severity: moderate Redness: yes Swelling: no Oozing: yes; bleeding Pus: no Fevers: no Nausea/vomiting: no Status: stable Treatments attempted: nothing, returned to office  Tetanus: UTD   Of note, he does take Eliquis for history of PE.  No Known Allergies  Outpatient Encounter Medications as of 02/13/2021  Medication Sig Note  . amoxicillin-clavulanate (AUGMENTIN) 875-125 MG tablet Take 1 tablet by mouth 2 (two) times daily for 5 days.   Marland Kitchen albuterol (VENTOLIN HFA) 108 (90 Base) MCG/ACT inhaler Inhale 2 puffs into the lungs every 6 (six) hours as needed for wheezing or shortness of breath.   Marland Kitchen apixaban (ELIQUIS) 5 MG TABS tablet Take 1 tablet (5 mg total) by mouth 2 (two) times daily. (Patient not taking: Reported on 02/13/2021)   . fluticasone (FLONASE) 50 MCG/ACT nasal spray Place 2 sprays into both nostrils daily. 12/23/2020: PRN seasonal  . omeprazole (PRILOSEC) 20 MG capsule Take 1 capsule (20 mg total) by mouth daily.   . predniSONE (DELTASONE) 20 MG tablet 3 tabs poqday 1-2, 2 tabs poqday 3-4, 1 tab poqday 5-6   . Probiotic Product (PROBIOTIC DAILY PO) Take 1 tablet by mouth daily.   . vitamin E 400 UNIT capsule Take 400 Units by mouth daily.    No facility-administered  encounter medications on file as of 02/13/2021.    Patient Active Problem List   Diagnosis Date Noted  . Pulmonary emboli ([place])   . Ascending aortic aneurysm ([place])   . Colon polyp   . Vasovagal syncope 02/12/2014  . Near syncope 01/30/2014  . GERD (gastroesophageal reflux disease)   . PVD 12/16/2009  . PALPITATIONS 12/12/2009  . CHEST PAIN 12/12/2009    Past Medical History:  Diagnosis Date  . Ascending aortic aneurysm ([place])    4 cm 2019  . Colon polyp    tubular adenoma 01/2018  . GERD (gastroesophageal reflux disease)   . Hiatal hernia   . Inguinal hernia   . Pulmonary emboli ([place])   . Pulmonary emboli (HCC)     Relevant past medical, surgical, family and social history reviewed and updated as indicated. Interim medical history since our last visit reviewed.  Review of Systems Per HPI unless specifically indicated above     Objective:    There were no vitals taken for this visit.  Wt Readings from Last 3 Encounters:  02/13/21 217 lb 9.6 oz (98.7 kg)  12/23/20 219 lb (99.3 kg)  09/02/20 215 lb (97.5 kg)    Physical Exam Constitutional:      General: He is not in acute distress.    Appearance: Normal appearance. He is not toxic-appearing.  Skin:  General: Skin is warm.     Coloration: Skin is not jaundiced or pale.     Findings: Erythema and lesion present. No bruising.       Neurological:     Mental Status: He is alert and oriented to person, place, and time.     Gait: Gait abnormal (walking with limp - reports he pulled a muscle when running from the dog).  Psychiatric:        Mood and Affect: Mood normal.        Behavior: Behavior normal.        Thought Content: Thought content normal.        Judgment: Judgment normal.        Assessment & Plan:  1. Dog bite, initial encounter Acute.  Wounds cleansed with normal saline and peroxide solution today in office.  Elected not to close wounds given risk for infection.  Bleeding controlled without  intervention.  Laceration and puncture wound to arm covered with petroleum gauze and 4x4 guaze with Kerlix to secure.  To remove bandage, cleanse, and reapply bandage in 24 hours.  Avoid getting wound wet in shower between now and then.  Will start prophylactic antibiotics for dog bite - Augmentin twice daily for 5 days.  Strongly encouraged rabies vaccination given unknown status of dog.  Discussed s/s systemic infection to seek emergent care.  Follow up in office on Monday for wound check.  - amoxicillin-clavulanate (AUGMENTIN) 875-125 MG tablet; Take 1 tablet by mouth 2 (two) times daily for 5 days.  Dispense: 10 tablet; Refill: 0    Follow up plan: Return in about 3 days (around 02/16/2021) for wound check.

## 2021-02-13 NOTE — Progress Notes (Signed)
Subjective:    Patient ID: Howard Davenport, male    DOB: Jul 14, 1946, 75 y.o.   MRN: [id number]  HPI  08/28/20 Patient is a pleasant 75 year old Caucasian gentleman who presents today with sudden onset of right-sided chest wall pain, pleurisy, and shortness of breath with activity.  He had Covid about 1 year ago.  Outside of that, he has no risk factors for a DVT or pulmonary embolism.  He states that yesterday he was lifting heavy material and so he may have strained a muscle.  However he felt fine when he went to bed last night.  This morning he awoke with sharp pain in his chest.  If he takes a deep breath, he developed severe right-sided chest pain.  He is unable to take deep inspirations during his pulmonary exam because of the chest pain.  He also reports shortness of breath with minimal activity.  This is out of the normal for this patient.  He even seems winded simply talking to me and he admits as much.  The remainder of his physical exam is normal.  I am unable to reproduce the chest pain with firm palpation on the ribs and on the right side chest wall.  There is no rash to suggest shingles.  His lungs are clear to auscultation there is no crackles to suggest pneumonia.  The pain is certainly in the right chest above the nipple and therefore I do not feel that it is cardiac in nature.  The pain seems out of proportion to simply a pulled muscle and therefore I am very concerned about a pulmonary embolism.  At that time, my plan was: I have a high pretest probability for a pulmonary embolism.  We discussed going to the emergency room however the patient is comfortable in the exam room today at rest.  He does not appear to be unstable.  Therefore I will check a D-dimer.  Meanwhile I will treat the patient empirically with Eliquis 10 mg twice daily until I have the results of the D-dimer.  If the D-dimer returns positive in the morning, I would arrange a stat CT scan of the chest to evaluate further.  If  the D-dimer is negative, I feel we could discontinue the Eliquis and treat the patient for musculoskeletal chest wall pain.  Patient is comfortable with this plan.  08/29/20 IMPRESSION: 1. Study is positive for lobar, segmental and subsegmental sized emboli in the lungs bilaterally. Although the majority of these appear nonocclusive, there is at least one occlusive segmental sized embolus to the right upper lobe which is associated with an area of pulmonary infarction complicated by small amount of alveolar hemorrhage, as detailed above. 2. Aortic atherosclerosis, in addition to 2 vessel coronary artery disease. Assessment for potential risk factor modification, dietary therapy or pharmacologic therapy may be warranted, if clinically indicated. 3. 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 4. Multiple small pulmonary nodules measuring 3 mm or less in size, stable compared to prior examinations dating back to 07/14/2018, considered definitively benign in need of no future imaging follow-up.  Patient is here today to discuss.  He states that he is already feeling better despite only taking 2 doses thus far of Eliquis.  He states that the pain has improved and his shortness of breath is slightly better.  He denies any frank mopped assist.  Chest pain is gradually improving.  I reviewed the CT scan findings with the patient.  We discussed the options including going to the hospital versus outpatient treatment.  Clinically the patient appears stable for outpatient treatment.  I reached out to a local pulmonologist, Dr. Silas Flood.  He was kind enough to review the case with me and agreed with outpatient treatment.  At that time, my plan was: Patient will continue Eliquis 10 mg twice daily for a total of 7 days and then switch to 5 mg twice daily for a total of 6 months thereafter.  I have asked to recheck the patient next week and at that time I will check a  CBC to monitor for any drop in his hemoglobin.  If he develops frank hemoptysis or worsening chest pain or worsening shortness of breath I recommended going to the hospital.  Patient is comfortable with this plan.  09/02/20 Patient's breathing has improved dramatically.  He states that he can barely feel the pain in the right side of his chest.  He is no longer having pain with deep inspiration.  His shortness of breath has improved markedly from last week.  He denies any frank hemoptysis.  He denies any melena or hematochezia.  He denies any epistaxis.  He denies any hematemesis.  At that time, my plan was: Clinically the patient is doing much better.  I believe that the patient likely had his PE due to his Covid infection and that he very well could have been caring this for quite some time given the fact infarction was seen on his CT scan.  I have recommended 6 months total of Eliquis.  He will complete a total 7-day course of 10 mg twice daily and then transition to 5 mg twice daily to complete 6 months total.  Check a CBC today to evaluate for any evidence of an occult blood loss.  Otherwise continue the medication for a total of 6 months.  12/23/20 Patient is a very pleasant 75 year old gentleman who comes in today for recheck.  He has a history of prediabetes and an elevated blood sugar.  He is due to recheck his A1c along with his cholesterol.  Overall he has been doing well on the Eliquis.  He denies any bleeding or bruising.  He does have a history of ascending aortic aneurysm.  This was last checked on his CT scan in October 2021.  He is due to recheck that again in October 2022.  He would like me to go ahead and schedule that.  His blood pressure today is excellent.  He denies any chest pain or shortness of breath or dyspnea on exertion   02/13/21 Patient states that he is got increasing shortness of breath.  This is been over the last few weeks.  Today on exam he is audibly wheezing with  decreased breath sounds in both lungs.  He does have a history of allergies and he recently started Claritin however the shortness of breath continues to occur.  He denies any cough however he states he feels like he cannot take a deep breath in.  This also recently began after he acquired COVID a second time earlier this year.  He denies any chest pain or pleurisy or hemoptysis or fever or chills.  There is no peripheral edema. Past Medical History:  Diagnosis Date  . Ascending aortic aneurysm ([place])    4 cm 2019  . Colon polyp    tubular adenoma 01/2018  . GERD (gastroesophageal reflux disease)   . Hiatal hernia   . Inguinal hernia   .  Pulmonary emboli ([place])   . Pulmonary emboli [facility])    Past Surgical History:  Procedure Laterality Date  . HERNIA REPAIR     Current Outpatient Medications on File Prior to Visit  Medication Sig Dispense Refill  . fluticasone (FLONASE) 50 MCG/ACT nasal spray Place 2 sprays into both nostrils daily. 16 g 6  . omeprazole (PRILOSEC) 20 MG capsule Take 1 capsule (20 mg total) by mouth daily. 90 capsule 3  . Probiotic Product (PROBIOTIC DAILY PO) Take 1 tablet by mouth daily.    . vitamin E 400 UNIT capsule Take 400 Units by mouth daily.    Marland Kitchen apixaban (ELIQUIS) 5 MG TABS tablet Take 1 tablet (5 mg total) by mouth 2 (two) times daily. (Patient not taking: Reported on 02/13/2021) 60 tablet 4   No current facility-administered medications on file prior to visit.   No Known Allergies Social History   Socioeconomic History  . Marital status: Married    Spouse name: Not on file  . Number of children: Not on file  . Years of education: Not on file  . Highest education level: Not on file  Occupational History  . Not on file  Tobacco Use  . Smoking status: Never Smoker  . Smokeless tobacco: Never Used  Substance and Sexual Activity  . Alcohol use: No  . Drug use: No  . Sexual activity: Yes  Other Topics Concern  . Not on file  Social History Narrative    Married with 2 children   Right handed   12 th    3 cups daily   Social Determinants of Health   Financial Resource Strain: Not on file  Food Insecurity: Not on file  Transportation Needs: Not on file  Physical Activity: Not on file  Stress: Not on file  Social Connections: Not on file  Intimate Partner Violence: Not on file      Review of Systems  All other systems reviewed and are negative.      Objective:   Physical Exam Constitutional:      General: He is not in acute distress.    Appearance: Normal appearance. He is not ill-appearing, toxic-appearing or diaphoretic.  Cardiovascular:     Rate and Rhythm: Normal rate and regular rhythm.     Heart sounds: Normal heart sounds. No murmur heard. No friction rub. No gallop.   Pulmonary:     Effort: Pulmonary effort is normal. No respiratory distress.     Breath sounds: Normal air entry. No stridor, decreased air movement or transmitted upper airway sounds. Wheezing present. No decreased breath sounds, rhonchi or rales.  Chest:     Chest wall: No deformity, swelling, tenderness, crepitus or edema. There is no dullness to percussion.  Musculoskeletal:     Right lower leg: No edema.     Left lower leg: No edema.  Lymphadenopathy:     Cervical: No cervical adenopathy.  Neurological:     Mental Status: He is alert.           Assessment & Plan:  Mild intermittent asthma with acute exacerbation  I believe the patient is having an asthma attack possibly brought on by Covid possibly brought on by his allergies.  He is still on his anticoagulant so I do not feel that a blood clot is the likely cause.  I will treat the patient with a prednisone taper pack and use albuterol 2 puffs every 4-6 hours as needed.  Recheck next week if no better or  sooner if worse.

## 2021-02-16 ENCOUNTER — Ambulatory Visit (INDEPENDENT_AMBULATORY_CARE_PROVIDER_SITE_OTHER): Payer: Medicare HMO | Admitting: Family Medicine

## 2021-02-16 ENCOUNTER — Other Ambulatory Visit: Payer: Self-pay

## 2021-02-16 ENCOUNTER — Encounter: Payer: Self-pay | Admitting: Family Medicine

## 2021-02-16 VITALS — BP 126/80 | HR 80 | Temp 98.1°F | Resp 14 | Ht 71.0 in | Wt 217.0 lb

## 2021-02-16 DIAGNOSIS — S41101D Unspecified open wound of right upper arm, subsequent encounter: Secondary | ICD-10-CM | POA: Diagnosis not present

## 2021-02-16 DIAGNOSIS — W540XXD Bitten by dog, subsequent encounter: Secondary | ICD-10-CM

## 2021-02-16 DIAGNOSIS — I712 Thoracic aortic aneurysm, without rupture: Secondary | ICD-10-CM | POA: Diagnosis not present

## 2021-02-16 DIAGNOSIS — W540XXA Bitten by dog, initial encounter: Secondary | ICD-10-CM

## 2021-02-16 DIAGNOSIS — I7121 Aneurysm of the ascending aorta, without rupture: Secondary | ICD-10-CM

## 2021-02-16 NOTE — Progress Notes (Signed)
Subjective:    Patient ID: Howard Davenport, male    DOB: Jul 14, 1946, 75 y.o.   MRN: [id number]  HPI  08/28/20 Patient is a pleasant 75 year old Caucasian gentleman who presents today with sudden onset of right-sided chest wall pain, pleurisy, and shortness of breath with activity.  He had Covid about 1 year ago.  Outside of that, he has no risk factors for a DVT or pulmonary embolism.  He states that yesterday he was lifting heavy material and so he may have strained a muscle.  However he felt fine when he went to bed last night.  This morning he awoke with sharp pain in his chest.  If he takes a deep breath, he developed severe right-sided chest pain.  He is unable to take deep inspirations during his pulmonary exam because of the chest pain.  He also reports shortness of breath with minimal activity.  This is out of the normal for this patient.  He even seems winded simply talking to me and he admits as much.  The remainder of his physical exam is normal.  I am unable to reproduce the chest pain with firm palpation on the ribs and on the right side chest wall.  There is no rash to suggest shingles.  His lungs are clear to auscultation there is no crackles to suggest pneumonia.  The pain is certainly in the right chest above the nipple and therefore I do not feel that it is cardiac in nature.  The pain seems out of proportion to simply a pulled muscle and therefore I am very concerned about a pulmonary embolism.  At that time, my plan was: I have a high pretest probability for a pulmonary embolism.  We discussed going to the emergency room however the patient is comfortable in the exam room today at rest.  He does not appear to be unstable.  Therefore I will check a D-dimer.  Meanwhile I will treat the patient empirically with Eliquis 10 mg twice daily until I have the results of the D-dimer.  If the D-dimer returns positive in the morning, I would arrange a stat CT scan of the chest to evaluate further.  If  the D-dimer is negative, I feel we could discontinue the Eliquis and treat the patient for musculoskeletal chest wall pain.  Patient is comfortable with this plan.  08/29/20 IMPRESSION: 1. Study is positive for lobar, segmental and subsegmental sized emboli in the lungs bilaterally. Although the majority of these appear nonocclusive, there is at least one occlusive segmental sized embolus to the right upper lobe which is associated with an area of pulmonary infarction complicated by small amount of alveolar hemorrhage, as detailed above. 2. Aortic atherosclerosis, in addition to 2 vessel coronary artery disease. Assessment for potential risk factor modification, dietary therapy or pharmacologic therapy may be warranted, if clinically indicated. 3. 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 4. Multiple small pulmonary nodules measuring 3 mm or less in size, stable compared to prior examinations dating back to 07/14/2018, considered definitively benign in need of no future imaging follow-up.  Patient is here today to discuss.  He states that he is already feeling better despite only taking 2 doses thus far of Eliquis.  He states that the pain has improved and his shortness of breath is slightly better.  He denies any frank mopped assist.  Chest pain is gradually improving.  I reviewed the CT scan findings with the patient.  We discussed the options including going to the hospital versus outpatient treatment.  Clinically the patient appears stable for outpatient treatment.  I reached out to a local pulmonologist, Dr. Silas Flood.  He was kind enough to review the case with me and agreed with outpatient treatment.  At that time, my plan was: Patient will continue Eliquis 10 mg twice daily for a total of 7 days and then switch to 5 mg twice daily for a total of 6 months thereafter.  I have asked to recheck the patient next week and at that time I will check a  CBC to monitor for any drop in his hemoglobin.  If he develops frank hemoptysis or worsening chest pain or worsening shortness of breath I recommended going to the hospital.  Patient is comfortable with this plan.  09/02/20 Patient's breathing has improved dramatically.  He states that he can barely feel the pain in the right side of his chest.  He is no longer having pain with deep inspiration.  His shortness of breath has improved markedly from last week.  He denies any frank hemoptysis.  He denies any melena or hematochezia.  He denies any epistaxis.  He denies any hematemesis.  At that time, my plan was: Clinically the patient is doing much better.  I believe that the patient likely had his PE due to his Covid infection and that he very well could have been caring this for quite some time given the fact infarction was seen on his CT scan.  I have recommended 6 months total of Eliquis.  He will complete a total 7-day course of 10 mg twice daily and then transition to 5 mg twice daily to complete 6 months total.  Check a CBC today to evaluate for any evidence of an occult blood loss.  Otherwise continue the medication for a total of 6 months.  12/23/20 Patient is a very pleasant 75 year old gentleman who comes in today for recheck.  He has a history of prediabetes and an elevated blood sugar.  He is due to recheck his A1c along with his cholesterol.  Overall he has been doing well on the Eliquis.  He denies any bleeding or bruising.  He does have a history of ascending aortic aneurysm.  This was last checked on his CT scan in October 2021.  He is due to recheck that again in October 2022.  He would like me to go ahead and schedule that.  His blood pressure today is excellent.  He denies any chest pain or shortness of breath or dyspnea on exertion   02/13/21 Patient states that he is got increasing shortness of breath.  This is been over the last few weeks.  Today on exam he is audibly wheezing with  decreased breath sounds in both lungs.  He does have a history of allergies and he recently started Claritin however the shortness of breath continues to occur.  He denies any cough however he states he feels like he cannot take a deep breath in.  This also recently began after he acquired COVID a second time earlier this year.  He denies any chest pain or pleurisy or hemoptysis or fever or chills.  There is no peripheral edema.  At that time, my plan was: I believe the patient is having an asthma attack possibly brought on by Covid possibly brought on by his allergies.  He is still on his anticoagulant so I do not feel that a blood clot is the likely  cause.  I will treat the patient with a prednisone taper pack and use albuterol 2 puffs every 4-6 hours as needed.  Recheck next week if no better or sooner if worse.  02/16/21 Patient's breathing has improved dramatically since being on prednisone.  Therefore I believe that he definitely was having an asthma exacerbation.  On pulmonary exam today, his lungs sound much clear.  His air movement has improved dramatically.  However the patient was attacked by pit bull shortly after I saw him.  The dog was on his chain at his home.  When the patient went to visit the owner of the pit bull, the dog attacked him.  Therefore we have no concern about rabies.  Unfortunately the patient was bitten on his right tricep.  There are several puncture wounds and a bite pattern on the right tricep.  There is bruising.  There is no erythema or warmth or evidence of cellulitis.  The patient has been on Augmentin since the event.  He also has a bruise and a puncture wound on his right suprapubic area.  The dog was able to bite him there when he fell down.  There is some bruising there over the right inguinal canal extending to the base of the shaft of the penis.  Again there is no erythema or evidence of secondary cellulitis. Past Medical History:  Diagnosis Date  . Ascending aortic  aneurysm ([place])    4 cm 2019  . Colon polyp    tubular adenoma 01/2018  . GERD (gastroesophageal reflux disease)   . Hiatal hernia   . Inguinal hernia   . Pulmonary emboli ([place])   . Pulmonary emboli [facility])    Past Surgical History:  Procedure Laterality Date  . HERNIA REPAIR     Current Outpatient Medications on File Prior to Visit  Medication Sig Dispense Refill  . albuterol (VENTOLIN HFA) 108 (90 Base) MCG/ACT inhaler Inhale 2 puffs into the lungs every 6 (six) hours as needed for wheezing or shortness of breath. 8 g 0  . amoxicillin-clavulanate (AUGMENTIN) 875-125 MG tablet Take 1 tablet by mouth 2 (two) times daily for 5 days. 10 tablet 0  . apixaban (ELIQUIS) 5 MG TABS tablet Take 1 tablet (5 mg total) by mouth 2 (two) times daily. 60 tablet 4  . fluticasone (FLONASE) 50 MCG/ACT nasal spray Place 2 sprays into both nostrils daily. 16 g 6  . omeprazole (PRILOSEC) 20 MG capsule Take 1 capsule (20 mg total) by mouth daily. 90 capsule 3  . predniSONE (DELTASONE) 20 MG tablet 3 tabs poqday 1-2, 2 tabs poqday 3-4, 1 tab poqday 5-6 12 tablet 0  . Probiotic Product (PROBIOTIC DAILY PO) Take 1 tablet by mouth daily.    . vitamin E 400 UNIT capsule Take 400 Units by mouth daily.     No current facility-administered medications on file prior to visit.   No Known Allergies Social History   Socioeconomic History  . Marital status: Married    Spouse name: Not on file  . Number of children: Not on file  . Years of education: Not on file  . Highest education level: Not on file  Occupational History  . Not on file  Tobacco Use  . Smoking status: Never Smoker  . Smokeless tobacco: Never Used  Substance and Sexual Activity  . Alcohol use: No  . Drug use: No  . Sexual activity: Yes  Other Topics Concern  . Not on file  Social History Narrative  Married with 2 children   Right handed   12 th    3 cups daily   Social Determinants of Health   Financial Resource Strain: Not on file   Food Insecurity: Not on file  Transportation Needs: Not on file  Physical Activity: Not on file  Stress: Not on file  Social Connections: Not on file  Intimate Partner Violence: Not on file      Review of Systems  All other systems reviewed and are negative.      Objective:   Physical Exam Constitutional:      General: He is not in acute distress.    Appearance: Normal appearance. He is not ill-appearing, toxic-appearing or diaphoretic.  Cardiovascular:     Rate and Rhythm: Normal rate and regular rhythm.     Heart sounds: Normal heart sounds. No murmur heard. No friction rub. No gallop.   Pulmonary:     Effort: Pulmonary effort is normal. No respiratory distress.     Breath sounds: Normal breath sounds and air entry. No stridor, decreased air movement or transmitted upper airway sounds. No decreased breath sounds, wheezing, rhonchi or rales.  Chest:     Chest wall: No deformity, swelling, tenderness, crepitus or edema. There is no dullness to percussion.  Musculoskeletal:       Arms:     Right lower leg: No edema.     Left lower leg: No edema.       Legs:  Lymphadenopathy:     Cervical: No cervical adenopathy.  Skin:    Findings: Bruising present. No erythema.  Neurological:     Mental Status: He is alert.           Assessment & Plan:  Dog bite, initial encounter  Ascending aortic aneurysm ([place])  No evidence of secondary cellulitis.  Finish Augmentin.  Patient's breathing has improved dramatically on the prednisone.  Therefore I feel that he is having an asthma attack.  We will simply monitor the patient.  If his breathing continues to experience periodic attacks, we may want to start him on an inhaled corticosteroid.  However the patient believes the most recent asthma attack was due to sawdust.  He has been renovating a trailer and has been breathing significant sawdust recently.

## 2021-02-23 ENCOUNTER — Telehealth: Payer: Self-pay | Admitting: Family Medicine

## 2021-02-23 NOTE — Progress Notes (Signed)
  Chronic Care Management   Outreach Note  02/23/2021 Name: Howard Davenport MRN: [id number] DOB: 04/25/1946  Referred by: Susy Frizzle, MD Reason for referral : No chief complaint on file.   An unsuccessful telephone outreach was attempted today. The patient was referred to the pharmacist for assistance with care management and care coordination.   Follow Up Plan:   Carley Perdue [employer] Scheduler

## 2021-03-02 ENCOUNTER — Other Ambulatory Visit: Payer: Self-pay | Admitting: Family Medicine

## 2021-03-09 ENCOUNTER — Telehealth: Payer: Self-pay | Admitting: Family Medicine

## 2021-03-09 NOTE — Progress Notes (Signed)
  Chronic Care Management   Outreach Note  03/09/2021 Name: Howard Davenport MRN: [id number] DOB: [birth year]  Referred by: Susy Frizzle, MD Reason for referral : No chief complaint on file.   A second unsuccessful telephone outreach was attempted today. The patient was referred to pharmacist for assistance with care management and care coordination.  Follow Up Plan:   Carley Perdue [employer] Scheduler

## 2021-03-25 ENCOUNTER — Telehealth: Payer: Self-pay | Admitting: Family Medicine

## 2021-03-25 NOTE — Progress Notes (Signed)
  Chronic Care Management   Outreach Note  03/25/2021 Name: Howard Davenport MRN: [id number] DOB: [birth year]  Referred by: Susy Frizzle, MD Reason for referral : No chief complaint on file.   Third unsuccessful telephone outreach was attempted today. The patient was referred to the pharmacist for assistance with care management and care coordination.   Follow Up Plan:   Carley Perdue [employer] Scheduler

## 2021-03-27 ENCOUNTER — Telehealth: Payer: Self-pay | Admitting: Family Medicine

## 2021-03-27 NOTE — Progress Notes (Signed)
  Chronic Care Management   Note  03/27/2021 Name: Howard Davenport MRN: [id number] DOB: Sep 04, 1946  Howard Davenport is a 75 y.o. year old male who is a primary care patient of Susy Frizzle, MD. I reached out to [employer] by phone today in response to a referral sent by Howard Davenport's PCP, Susy Frizzle, MD.   Mr. Smithey was given information about Chronic Care Management services today including:  1. CCM service includes personalized support from designated clinical staff supervised by his physician, including individualized plan of care and coordination with other care providers 2. 24/7 contact phone numbers for assistance for urgent and routine care needs. 3. Service will only be billed when office clinical staff spend 20 minutes or more in a month to coordinate care. 4. Only one practitioner may furnish and bill the service in a calendar month. 5. The patient may stop CCM services at any time (effective at the end of the month) by phone call to the office staff.   Patient did not agree to enrollment in care management services and does not wish to consider at this time.  Follow up plan:   Carley Perdue [employer] Scheduler

## 2021-04-17 ENCOUNTER — Ambulatory Visit
Admission: RE | Admit: 2021-04-17 | Discharge: 2021-04-17 | Disposition: A | Discharge status: Home / Self Care | Payer: Medicare HMO | Source: Ambulatory Visit | Attending: Family Medicine | Admitting: Family Medicine

## 2021-04-17 ENCOUNTER — Ambulatory Visit (INDEPENDENT_AMBULATORY_CARE_PROVIDER_SITE_OTHER): Payer: Medicare HMO | Admitting: Family Medicine

## 2021-04-17 ENCOUNTER — Other Ambulatory Visit: Payer: Self-pay

## 2021-04-17 ENCOUNTER — Encounter: Payer: Self-pay | Admitting: Family Medicine

## 2021-04-17 VITALS — BP 136/76 | HR 84 | Temp 97.6°F | Resp 16 | Ht 71.0 in | Wt 219.0 lb

## 2021-04-17 DIAGNOSIS — R0789 Other chest pain: Secondary | ICD-10-CM | POA: Diagnosis not present

## 2021-04-17 DIAGNOSIS — R079 Chest pain, unspecified: Secondary | ICD-10-CM | POA: Diagnosis not present

## 2021-04-17 DIAGNOSIS — Z86711 Personal history of pulmonary embolism: Secondary | ICD-10-CM | POA: Diagnosis not present

## 2021-04-17 NOTE — Progress Notes (Signed)
Subjective:    Patient ID: Howard Davenport, male    DOB: Jul 14, 1946, 75 y.o.   MRN: [id number]  HPI  08/28/20 Patient is a pleasant 75 year old Caucasian gentleman who presents today with sudden onset of right-sided chest wall pain, pleurisy, and shortness of breath with activity.  He had Covid about 1 year ago.  Outside of that, he has no risk factors for a DVT or pulmonary embolism.  He states that yesterday he was lifting heavy material and so he may have strained a muscle.  However he felt fine when he went to bed last night.  This morning he awoke with sharp pain in his chest.  If he takes a deep breath, he developed severe right-sided chest pain.  He is unable to take deep inspirations during his pulmonary exam because of the chest pain.  He also reports shortness of breath with minimal activity.  This is out of the normal for this patient.  He even seems winded simply talking to me and he admits as much.  The remainder of his physical exam is normal.  I am unable to reproduce the chest pain with firm palpation on the ribs and on the right side chest wall.  There is no rash to suggest shingles.  His lungs are clear to auscultation there is no crackles to suggest pneumonia.  The pain is certainly in the right chest above the nipple and therefore I do not feel that it is cardiac in nature.  The pain seems out of proportion to simply a pulled muscle and therefore I am very concerned about a pulmonary embolism.  At that time, my plan was: I have a high pretest probability for a pulmonary embolism.  We discussed going to the emergency room however the patient is comfortable in the exam room today at rest.  He does not appear to be unstable.  Therefore I will check a D-dimer.  Meanwhile I will treat the patient empirically with Eliquis 10 mg twice daily until I have the results of the D-dimer.  If the D-dimer returns positive in the morning, I would arrange a stat CT scan of the chest to evaluate further.  If  the D-dimer is negative, I feel we could discontinue the Eliquis and treat the patient for musculoskeletal chest wall pain.  Patient is comfortable with this plan.  08/29/20 IMPRESSION: 1. Study is positive for lobar, segmental and subsegmental sized emboli in the lungs bilaterally. Although the majority of these appear nonocclusive, there is at least one occlusive segmental sized embolus to the right upper lobe which is associated with an area of pulmonary infarction complicated by small amount of alveolar hemorrhage, as detailed above. 2. Aortic atherosclerosis, in addition to 2 vessel coronary artery disease. Assessment for potential risk factor modification, dietary therapy or pharmacologic therapy may be warranted, if clinically indicated. 3. 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 4. Multiple small pulmonary nodules measuring 3 mm or less in size, stable compared to prior examinations dating back to 07/14/2018, considered definitively benign in need of no future imaging follow-up.  Patient is here today to discuss.  He states that he is already feeling better despite only taking 2 doses thus far of Eliquis.  He states that the pain has improved and his shortness of breath is slightly better.  He denies any frank mopped assist.  Chest pain is gradually improving.  I reviewed the CT scan findings with the patient.  We discussed the options including going to the hospital versus outpatient treatment.  Clinically the patient appears stable for outpatient treatment.  I reached out to a local pulmonologist, Dr. Silas Flood.  He was kind enough to review the case with me and agreed with outpatient treatment.  At that time, my plan was: Patient will continue Eliquis 10 mg twice daily for a total of 7 days and then switch to 5 mg twice daily for a total of 6 months thereafter.  I have asked to recheck the patient next week and at that time I will check a  CBC to monitor for any drop in his hemoglobin.  If he develops frank hemoptysis or worsening chest pain or worsening shortness of breath I recommended going to the hospital.  Patient is comfortable with this plan.  09/02/20 Patient's breathing has improved dramatically.  He states that he can barely feel the pain in the right side of his chest.  He is no longer having pain with deep inspiration.  His shortness of breath has improved markedly from last week.  He denies any frank hemoptysis.  He denies any melena or hematochezia.  He denies any epistaxis.  He denies any hematemesis.  At that time, my plan was: Clinically the patient is doing much better.  I believe that the patient likely had his PE due to his Covid infection and that he very well could have been caring this for quite some time given the fact infarction was seen on his CT scan.  I have recommended 6 months total of Eliquis.  He will complete a total 7-day course of 10 mg twice daily and then transition to 5 mg twice daily to complete 6 months total.  Check a CBC today to evaluate for any evidence of an occult blood loss.  Otherwise continue the medication for a total of 6 months.  12/23/20 Patient is a very pleasant 75 year old gentleman who comes in today for recheck.  He has a history of prediabetes and an elevated blood sugar.  He is due to recheck his A1c along with his cholesterol.  Overall he has been doing well on the Eliquis.  He denies any bleeding or bruising.  He does have a history of ascending aortic aneurysm.  This was last checked on his CT scan in October 2021.  He is due to recheck that again in October 2022.  He would like me to go ahead and schedule that.  His blood pressure today is excellent.  He denies any chest pain or shortness of breath or dyspnea on exertion.  At that time my plan was: Overall the patient is doing well.  He will finish his Eliquis in April.  Check CBC, CMP, lipid panel, and A1c due to his history of  prediabetes.  Ideally I like his A1c to be below 6.5 and his LDL cholesterol to be below 100.  I will also schedule the patient for a follow-up CT scan to evaluate the a sending aortic aneurysm for October of this year.  Continue to try to encourage the patient to get his Covid shot.  04/17/21  In 2015, the patient was having some left-sided chest wall pain.  He had an extensive work-up including a stress test and echocardiogram that revealed no underlying cardiovascular issues.  However he states that the left-sided chest pain improved while he was on Eliquis.  He discontinued Eliquis in April after completing 6 months of therapy for his pulmonary embolism.  In the last  few weeks, he states that the left-sided chest pain has returned.  He denies any exacerbation of the pain with activity.  He denies any angina.  He states in fact, sometimes the pain intensifies when he lays down at night.  It certainly does not seem to be exercise or stress related.  He states that with the heat, he is also noticed some mild shortness of breath with activity however he believes some of that may be asthma because he did see some improvement while we gave him prednisone earlier this spring.  His concern is that he may be developing blood clots in his lungs again.  His exam today is unremarkable other than some mild right basilar crackles which sound like atelectasis  Past Medical History:  Diagnosis Date  . Ascending aortic aneurysm ([place])    4 cm 2019  . Colon polyp    tubular adenoma 01/2018  . GERD (gastroesophageal reflux disease)   . Hiatal hernia   . Inguinal hernia   . Pulmonary emboli ([place])   . Pulmonary emboli [facility])    Past Surgical History:  Procedure Laterality Date  . HERNIA REPAIR     Current Outpatient Medications on File Prior to Visit  Medication Sig Dispense Refill  . albuterol (VENTOLIN HFA) 108 (90 Base) MCG/ACT inhaler Inhale 2 puffs into the lungs every 6 (six) hours as needed for wheezing or  shortness of breath. 8 g 0  . ELIQUIS 5 MG TABS tablet TAKE 1 TABLET BY MOUTH TWICE A DAY 60 tablet 4  . fluticasone (FLONASE) 50 MCG/ACT nasal spray Place 2 sprays into both nostrils daily. 16 g 6  . omeprazole (PRILOSEC) 20 MG capsule Take 1 capsule (20 mg total) by mouth daily. 90 capsule 3  . predniSONE (DELTASONE) 20 MG tablet 3 tabs poqday 1-2, 2 tabs poqday 3-4, 1 tab poqday 5-6 12 tablet 0  . Probiotic Product (PROBIOTIC DAILY PO) Take 1 tablet by mouth daily.    . vitamin E 400 UNIT capsule Take 400 Units by mouth daily.     No current facility-administered medications on file prior to visit.   No Known Allergies Social History   Socioeconomic History  . Marital status: Married    Spouse name: Not on file  . Number of children: Not on file  . Years of education: Not on file  . Highest education level: Not on file  Occupational History  . Not on file  Tobacco Use  . Smoking status: Never Smoker  . Smokeless tobacco: Never Used  Substance and Sexual Activity  . Alcohol use: No  . Drug use: No  . Sexual activity: Yes  Other Topics Concern  . Not on file  Social History Narrative   Married with 2 children   Right handed   12 th    3 cups daily   Social Determinants of Health   Financial Resource Strain: Not on file  Food Insecurity: Not on file  Transportation Needs: Not on file  Physical Activity: Not on file  Stress: Not on file  Social Connections: Not on file  Intimate Partner Violence: Not on file      Review of Systems  All other systems reviewed and are negative.      Objective:   Physical Exam Constitutional:      General: He is not in acute distress.    Appearance: Normal appearance. He is not ill-appearing, toxic-appearing or diaphoretic.  Cardiovascular:     Rate and Rhythm: Normal  rate and regular rhythm.     Heart sounds: Normal heart sounds. No murmur heard. No friction rub. No gallop.   Pulmonary:     Effort: Pulmonary effort is  normal. No respiratory distress.     Breath sounds: Normal air entry. No stridor, decreased air movement or transmitted upper airway sounds. Examination of the right-lower field reveals rales. Rales present. No decreased breath sounds, wheezing or rhonchi.  Chest:     Chest wall: No deformity, swelling, tenderness, crepitus or edema. There is no dullness to percussion.  Musculoskeletal:     Right lower leg: No edema.     Left lower leg: No edema.  Lymphadenopathy:     Cervical: No cervical adenopathy.  Neurological:     Mental Status: He is alert.           Assessment & Plan:  History of pulmonary embolism - Plan: CBC with Differential/Platelet, COMPLETE METABOLIC PANEL WITH GFR, D-dimer, quantitative  Chest pain, unspecified type - Plan: CBC with Differential/Platelet, COMPLETE METABOLIC PANEL WITH GFR, D-dimer, quantitative, DG Chest 2 View  Patient states that this chest pain has been present off and on for years.  However it seemed to improve while he was on Eliquis and it seems to have returned when he stopped it.  Therefore he is concerned that he may have a pulmonary embolism.  I certainly believe it is reasonable to check a D-dimer.  Given his shortness of breath I will also check a chest x-ray and a CBC.  If the chest x-ray and the D-dimer are normal, his CBC is normal, I feel no further work-up is necessary as I believe this may be chest wall pain that has been present for quite some time.  Obviously if the D-dimer is positive, I would immediately want to get a CT scan of the lungs to rule out pulmonary embolism.  However my pretest probability is quite low given how comfortable the patient appears today on his exam and his previous history

## 2021-04-19 LAB — COMPLETE METABOLIC PANEL WITH GFR
AG Ratio: 1.7 (calc) (ref 1.0–2.5)
ALT: 11 U/L (ref 9–46)
AST: 12 U/L (ref 10–35)
Albumin: 4.3 g/dL (ref 3.6–5.1)
Alkaline phosphatase (APISO): 60 U/L (ref 35–144)
BUN: 19 mg/dL (ref 7–25)
CO2: 28 mmol/L (ref 20–32)
Calcium: 9.2 mg/dL (ref 8.6–10.3)
Chloride: 103 mmol/L (ref 98–110)
Creat: 1.07 mg/dL (ref 0.70–1.18)
GFR, Est African American: 79 mL/min/{1.73_m2} (ref >=60)
GFR, Est Non African American: 68 mL/min/{1.73_m2} (ref >=60)
Globulin: 2.6 g/dL (calc) (ref 1.9–3.7)
Glucose, Bld: 106 mg/dL — ABNORMAL HIGH (ref 65–99)
Potassium: 5 mmol/L (ref 3.5–5.3)
Sodium: 138 mmol/L (ref 135–146)
Total Bilirubin: 0.5 mg/dL (ref 0.2–1.2)
Total Protein: 6.9 g/dL (ref 6.1–8.1)

## 2021-04-19 LAB — CBC WITH DIFFERENTIAL/PLATELET
Absolute Monocytes: 555 cells/uL (ref 200–950)
Basophils Absolute: 49 cells/uL (ref 0–200)
Basophils Relative: 0.8 %
Eosinophils Absolute: 128 cells/uL (ref 15–500)
Eosinophils Relative: 2.1 %
HCT: 46.2 % (ref 38.5–50.0)
Hemoglobin: 15.5 g/dL (ref 13.2–17.1)
Lymphs Abs: 2312 cells/uL (ref 850–3900)
MCH: 31.1 pg (ref 27.0–33.0)
MCHC: 33.5 g/dL (ref 32.0–36.0)
MCV: 92.6 fL (ref 80.0–100.0)
MPV: 10.4 fL (ref 7.5–12.5)
Monocytes Relative: 9.1 %
Neutro Abs: 3056 cells/uL (ref 1500–7800)
Neutrophils Relative %: 50.1 %
Platelets: 185 10*3/uL (ref 140–400)
RBC: 4.99 10*6/uL (ref 4.20–5.80)
RDW: 13.1 % (ref 11.0–15.0)
Total Lymphocyte: 37.9 %
WBC: 6.1 10*3/uL (ref 3.8–10.8)

## 2021-04-19 LAB — D-DIMER, QUANTITATIVE

## 2021-04-23 ENCOUNTER — Other Ambulatory Visit: Payer: Medicare HMO

## 2021-04-23 ENCOUNTER — Other Ambulatory Visit: Payer: Self-pay

## 2021-04-23 DIAGNOSIS — R079 Chest pain, unspecified: Secondary | ICD-10-CM | POA: Diagnosis not present

## 2021-04-23 DIAGNOSIS — Z86711 Personal history of pulmonary embolism: Secondary | ICD-10-CM | POA: Diagnosis not present

## 2021-04-24 LAB — D-DIMER, QUANTITATIVE: D-Dimer, Quant: 0.29 mcg/mL FEU (ref <0.50)

## 2021-04-29 DIAGNOSIS — D225 Melanocytic nevi of trunk: Secondary | ICD-10-CM | POA: Diagnosis not present

## 2021-04-29 DIAGNOSIS — D2272 Melanocytic nevi of left lower limb, including hip: Secondary | ICD-10-CM | POA: Diagnosis not present

## 2021-04-29 DIAGNOSIS — L812 Freckles: Secondary | ICD-10-CM | POA: Diagnosis not present

## 2021-04-29 DIAGNOSIS — D1801 Hemangioma of skin and subcutaneous tissue: Secondary | ICD-10-CM | POA: Diagnosis not present

## 2021-04-29 DIAGNOSIS — L570 Actinic keratosis: Secondary | ICD-10-CM | POA: Diagnosis not present

## 2021-04-29 DIAGNOSIS — L821 Other seborrheic keratosis: Secondary | ICD-10-CM | POA: Diagnosis not present

## 2021-04-29 DIAGNOSIS — D2271 Melanocytic nevi of right lower limb, including hip: Secondary | ICD-10-CM | POA: Diagnosis not present

## 2021-06-01 DIAGNOSIS — M25511 Pain in right shoulder: Secondary | ICD-10-CM | POA: Diagnosis not present

## 2021-08-19 ENCOUNTER — Other Ambulatory Visit: Payer: Self-pay

## 2021-08-19 ENCOUNTER — Ambulatory Visit (INDEPENDENT_AMBULATORY_CARE_PROVIDER_SITE_OTHER): Payer: Medicare HMO | Admitting: *Deleted

## 2021-08-19 DIAGNOSIS — Z23 Encounter for immunization: Secondary | ICD-10-CM

## 2021-09-07 ENCOUNTER — Other Ambulatory Visit: Payer: Self-pay | Admitting: Family Medicine

## 2021-10-13 ENCOUNTER — Telehealth: Payer: Self-pay | Admitting: Family Medicine

## 2021-10-13 NOTE — Telephone Encounter (Signed)
Received call from [facility] at [place]. Patient's appt for CTA chest canceled by their office; authorization still under review. Please advise at [phone], ext [phone] for rescheduling.

## 2021-10-14 ENCOUNTER — Inpatient Hospital Stay: Admission: RE | Admit: 2021-10-14 | Payer: Medicare HMO | Source: Ambulatory Visit

## 2021-11-10 ENCOUNTER — Other Ambulatory Visit: Payer: Self-pay

## 2021-11-10 ENCOUNTER — Ambulatory Visit
Admission: RE | Admit: 2021-11-10 | Discharge: 2021-11-10 | Disposition: A | Discharge status: Home / Self Care | Payer: Medicare HMO | Source: Ambulatory Visit | Attending: Family Medicine | Admitting: Family Medicine

## 2021-11-10 DIAGNOSIS — I7121 Aneurysm of the ascending aorta, without rupture: Secondary | ICD-10-CM

## 2021-11-10 DIAGNOSIS — I700 Atherosclerosis of aorta: Secondary | ICD-10-CM | POA: Diagnosis not present

## 2021-11-10 MED ORDER — IOPAMIDOL (ISOVUE-370) INJECTION 76%
75.0000 mL | Freq: Once | INTRAVENOUS | Status: AC | PRN
  Administered 2021-11-10: 09:00:00 75 mL via INTRAVENOUS

## 2021-11-26 DIAGNOSIS — R3912 Poor urinary stream: Secondary | ICD-10-CM | POA: Diagnosis not present

## 2021-11-26 DIAGNOSIS — N401 Enlarged prostate with lower urinary tract symptoms: Secondary | ICD-10-CM | POA: Diagnosis not present

## 2022-01-18 DIAGNOSIS — H2513 Age-related nuclear cataract, bilateral: Secondary | ICD-10-CM | POA: Diagnosis not present

## 2022-01-18 DIAGNOSIS — H04123 Dry eye syndrome of bilateral lacrimal glands: Secondary | ICD-10-CM | POA: Diagnosis not present

## 2022-01-18 DIAGNOSIS — H04213 Epiphora due to excess lacrimation, bilateral lacrimal glands: Secondary | ICD-10-CM | POA: Diagnosis not present

## 2022-03-19 ENCOUNTER — Other Ambulatory Visit: Payer: Self-pay

## 2022-03-19 MED ORDER — OMEPRAZOLE 20 MG PO CPDR: 20.0000 mg | DELAYED_RELEASE_CAPSULE | Freq: Every day | ORAL | 0 refills | Status: DC

## 2022-03-19 NOTE — Telephone Encounter (Signed)
Requested Prescriptions  ?Pending Prescriptions Disp Refills  ?? omeprazole (PRILOSEC) 20 MG capsule 90 capsule 0  ?  Sig: Take 1 capsule (20 mg total) by mouth daily.  ?  ? Gastroenterology: Proton Pump Inhibitors Passed - 03/19/2022  8:55 AM  ?  ?  Passed - Valid encounter within last 12 months  ?  Recent Outpatient Visits   ?      ? 11 months ago History of pulmonary embolism  ? [facility] [place], Cammie Mcgee, MD  ? 1 year ago Dog bite, initial encounter  ? [facility] [place], Cammie Mcgee, MD  ? 1 year ago Dog bite, initial encounter  ? [place], NP  ? 1 year ago Mild intermittent asthma with acute exacerbation  ? [facility] [place], Cammie Mcgee, MD  ? 1 year ago Pulmonary embolism and infarction [facility])  ? [facility] [place], Cammie Mcgee, MD  ?  ?  ? ?  ?  ?  ? ? ?

## 2022-03-19 NOTE — Telephone Encounter (Signed)
Pt called in to request a refill of this med omeprazole (PRILOSEC) 20 MG to be sent to [facility] on [address]. Pt asks if this could be a 90 day supply. ? ?Cb#: [phone] ? ?

## 2022-05-05 DIAGNOSIS — L578 Other skin changes due to chronic exposure to nonionizing radiation: Secondary | ICD-10-CM | POA: Diagnosis not present

## 2022-05-05 DIAGNOSIS — L570 Actinic keratosis: Secondary | ICD-10-CM | POA: Diagnosis not present

## 2022-05-05 DIAGNOSIS — L821 Other seborrheic keratosis: Secondary | ICD-10-CM | POA: Diagnosis not present

## 2022-05-05 DIAGNOSIS — D2261 Melanocytic nevi of right upper limb, including shoulder: Secondary | ICD-10-CM | POA: Diagnosis not present

## 2022-05-05 DIAGNOSIS — L812 Freckles: Secondary | ICD-10-CM | POA: Diagnosis not present

## 2022-05-05 DIAGNOSIS — D1801 Hemangioma of skin and subcutaneous tissue: Secondary | ICD-10-CM | POA: Diagnosis not present

## 2022-05-05 DIAGNOSIS — D225 Melanocytic nevi of trunk: Secondary | ICD-10-CM | POA: Diagnosis not present

## 2022-05-05 DIAGNOSIS — D2272 Melanocytic nevi of left lower limb, including hip: Secondary | ICD-10-CM | POA: Diagnosis not present

## 2022-08-05 ENCOUNTER — Ambulatory Visit (INDEPENDENT_AMBULATORY_CARE_PROVIDER_SITE_OTHER): Payer: Medicare HMO

## 2022-08-05 ENCOUNTER — Encounter: Payer: Self-pay | Admitting: Sports Medicine

## 2022-08-05 ENCOUNTER — Ambulatory Visit: Payer: Medicare HMO | Admitting: Podiatry

## 2022-08-05 ENCOUNTER — Ambulatory Visit (INDEPENDENT_AMBULATORY_CARE_PROVIDER_SITE_OTHER): Payer: Medicare HMO | Admitting: Sports Medicine

## 2022-08-05 VITALS — BP 122/79 | HR 65 | Ht 70.0 in | Wt 215.0 lb

## 2022-08-05 DIAGNOSIS — M25461 Effusion, right knee: Secondary | ICD-10-CM | POA: Diagnosis not present

## 2022-08-05 DIAGNOSIS — R269 Unspecified abnormalities of gait and mobility: Secondary | ICD-10-CM

## 2022-08-05 DIAGNOSIS — M25561 Pain in right knee: Secondary | ICD-10-CM

## 2022-08-05 DIAGNOSIS — M722 Plantar fascial fibromatosis: Secondary | ICD-10-CM | POA: Diagnosis not present

## 2022-08-05 MED ORDER — MELOXICAM 15 MG PO TABS: 15.0000 mg | ORAL_TABLET | Freq: Every day | ORAL | 1 refills | Status: DC

## 2022-08-05 NOTE — Progress Notes (Signed)
Howard Davenport - 76 y.o. male MRN [id number]  Date of birth: 06-25-1946  Office Visit Note: Visit Date: 08/05/2022 PCP: Susy Frizzle, MD Referred by: Susy Frizzle, MD  Subjective: Chief Complaint  Patient presents with   Right Knee - Pain   HPI: Howard Davenport is a pleasant 76 y.o. male who presents today for acute right knee pain.   The pain has been bothering him for about 3 weeks.  He had an issue with his right foot and so he admits that he has been compensating and walking differently because of this which has aggravated his knee.  The foot is not cured but it is feeling better.  He does take meloxicam which is helping the foot, but not the knee.  He had tried icing the knee but this made the pain worse, he has been heating it with some relief.  He notes the knee is still swollen although it was more swollen and weeks past.  Denies any specific injury to the knee or previous issues with that right knee. He does have an office appointment with podiatry later today for the foot.  Pertinent ROS were reviewed with the patient and found to be negative unless otherwise specified above in HPI.   Assessment & Plan: Visit Diagnoses:  1. Acute pain of right knee   2. Pain and swelling of right knee   3. Gait abnormality    Plan: Had a good discussion with Salil about his acute right knee pain and swelling.  His pain is likely result of ambulating differently secondary to compensating for his right foot pain.  He does have still some swelling on the knee, although it sounds like it is improved from previously which has reduced with a prior meloxicam prescription and some heat.  This is likely a mild OA flare versus flare of possible degenerative meniscus as he does have pain on the medial compartment.  We discussed all treatment options such as aspiration and/or injection, NSAID therapy, if any modification.  We decided to write a prescription for meloxicam 15 mg once daily.  He may  obtain a knee compressive sleeve for some gentle compression to help with the swelling and pain as well.  I did evaluate his gait with standing which does show a mild collapse of the longitudinal and transverse arch with some overpronation of the feet.  We did discuss possible insole/orthotic modification.  He will check in with podiatry to see what their recommendations are, but he may come back if he wishes to proceed with insole modification.  I would likely put him in green sports insoles with scaphoid pad on the medial compartment of both feet.  He will follow-up in 3 to 4 weeks if not improving.  Follow-up: 3-4 weeks if not improving   Meds & Orders:  Meds ordered this encounter  Medications   meloxicam (MOBIC) 15 MG tablet    Sig: Take 1 tablet (15 mg total) by mouth daily.    Dispense:  30 tablet    Refill:  1    Orders Placed This Encounter  Procedures   XR Knee Complete 4 Views Right     Procedures: No procedures performed      Clinical History: No specialty comments available.  He reports that he has never smoked. He has never used smokeless tobacco. No results for input(s): "HGBA1C", "LABURIC" in the last 8760 hours.  Objective:   Vital Signs: BP 122/79   Pulse 65  Ht '5\' 10"'$  (1.778 m)   Wt 215 lb (97.5 kg)   BMI 30.85 kg/m   Physical Exam  Gen: Well-appearing, in no acute distress; non-toxic CV: Regular Rate. Well-perfused. Warm.  Resp: Breathing unlabored on room air; no wheezing. Psych: Fluid speech in conversation; appropriate affect; normal thought process Neuro: Sensation intact throughout. No gross coordination deficits.   Ortho Exam - Right knee: Evaluation of the right knee does show a mild effusion present.  There is mild warmth associated with the knee without redness or erythema.  Some TTP over the medial aspect of the joint line.  No condyle tenderness.  There is some crepitation of the knee with flexion and extension.  Range of motion is slightly  restricted from 0-120 degrees, compared to 0-135 degrees of the contralateral knee.  There is some pain with McMurray's testing on the medial side without associated click, equivocal Thessaly's testing.  No varus or valgus instability.  Strength 5/5 with knee flexion and extension.  Neurovascular intact distally.  Imaging: XR Knee Complete 4 Views Right  Result Date: 08/05/2022 4 views of the right knee including bilateral AP, Rosenberg, lateral and sunrise views were ordered and reviewed by myself.  X-rays show mild to moderate medial joint space narrowing and associated OA.  There is some spurring on the posterior aspect of the patella as well as enthesophytes at the superior aspect of the patella.  No acute fracture or otherwise bony abnormality noted.   Past Medical/Family/Surgical/Social History: Medications & Allergies reviewed per EMR, new medications updated. Patient Active Problem List   Diagnosis Date Noted   Pulmonary emboli [facility])    Ascending aortic aneurysm ([facility])    Colon polyp    Vasovagal syncope 02/12/2014   Near syncope 01/30/2014   GERD (gastroesophageal reflux disease)    PVD 12/16/2009   PALPITATIONS 12/12/2009   CHEST PAIN 12/12/2009   Past Medical History:  Diagnosis Date   Ascending aortic aneurysm ([facility])    4 cm 2019   Colon polyp    tubular adenoma 01/2018   GERD (gastroesophageal reflux disease)    Hiatal hernia    Inguinal hernia    Pulmonary emboli ([facility])    Pulmonary emboli ([facility])    Family History  Problem Relation Age of Onset   Leukemia Mother    Heart disease Father    Past Surgical History:  Procedure Laterality Date   HERNIA REPAIR     Social History   Occupational History   Not on file  Tobacco Use   Smoking status: Never   Smokeless tobacco: Never  Substance and Sexual Activity   Alcohol use: No   Drug use: No   Sexual activity: Yes

## 2022-08-05 NOTE — Progress Notes (Signed)
3 weeks of right knee pain. Had an issue with his right foot, he began to compensate and aggravated the knee. Minimal swelling in the knee. Meloxicam for his foot, did not help the knee as much.

## 2022-08-05 NOTE — Progress Notes (Signed)
Subjective:  Patient ID: Howard Davenport, male    DOB: [birth year],  MRN: [id number]  Chief Complaint  Patient presents with   Foot Pain    Pt stated that he has some pain in his heel feels like a bruise     76 y.o. male presents with the above complaint.  Patient presents with right heel pain that has been on for quite some time patient feels like he is walking on a bruise.  He has not seen anyone as prior to see me today for this.  States it hurts with ambulation hurts with pressure he wanted to get it evaluated pain scale is 5 out of 10 dull achy in nature.  He is experiencing post attic dyskinesia.   Review of Systems: Negative except as noted in the HPI. Denies N/V/F/Ch.  Past Medical History:  Diagnosis Date   Ascending aortic aneurysm ([place])    4 cm 2019   Colon polyp    tubular adenoma 01/2018   GERD (gastroesophageal reflux disease)    Hiatal hernia    Inguinal hernia    Pulmonary emboli ([facility])    Pulmonary emboli ([facility])     Current Outpatient Medications:    albuterol (VENTOLIN HFA) 108 (90 Base) MCG/ACT inhaler, TAKE 2 PUFFS BY MOUTH EVERY 6 HOURS AS NEEDED FOR WHEEZE OR SHORTNESS OF BREATH, Disp: 8.5 each, Rfl: 2   fluticasone (FLONASE) 50 MCG/ACT nasal spray, Place 2 sprays into both nostrils daily., Disp: 16 g, Rfl: 6   meloxicam (MOBIC) 15 MG tablet, Take 1 tablet (15 mg total) by mouth daily., Disp: 30 tablet, Rfl: 1   omeprazole (PRILOSEC) 20 MG capsule, Take 1 capsule (20 mg total) by mouth daily., Disp: 90 capsule, Rfl: 0   Probiotic Product (PROBIOTIC DAILY PO), Take 1 tablet by mouth daily., Disp: , Rfl:    vitamin E 400 UNIT capsule, Take 400 Units by mouth daily., Disp: , Rfl:   Social History   Tobacco Use  Smoking Status Never  Smokeless Tobacco Never    No Known Allergies Objective:  There were no vitals filed for this visit. There is no height or weight on file to calculate BMI. Constitutional Well developed. Well nourished.  Vascular Dorsalis  pedis pulses palpable bilaterally. Posterior tibial pulses palpable bilaterally. Capillary refill normal to all digits.  No cyanosis or clubbing noted. Pedal hair growth normal.  Neurologic Normal speech. Oriented to person, place, and time. Epicritic sensation to light touch grossly present bilaterally.  Dermatologic Nails well groomed and normal in appearance. No open wounds. No skin lesions.  Orthopedic: Normal joint ROM without pain or crepitus bilaterally. No visible deformities. Tender to palpation at the calcaneal tuber right. No pain with calcaneal squeeze right. Ankle ROM diminished range of motion right. Silfverskiold Test: positive right.   Radiographs:   Assessment:   1. Plantar fasciitis of right foot    Plan:  Patient was evaluated and treated and all questions answered.  Plantar Fasciitis, right - XR reviewed as above.  - Educated on icing and stretching. Instructions given.  - Injection delivered to the plantar fascia as below. - DME: Plantar fascial brace dispensed to support the medial longitudinal arch of the foot and offload pressure from the heel and prevent arch collapse during weightbearing - Pharmacologic management: None  Procedure: Injection Tendon/Ligament Location: Right plantar fascia at the glabrous junction; medial approach. Skin Prep: alcohol Injectate: 0.5 cc 0.5% marcaine plain, 0.5 cc of 1% Lidocaine, 0.5 cc kenalog 10. Disposition:  Patient tolerated procedure well. Injection site dressed with a band-aid.  No follow-ups on file.

## 2022-08-11 ENCOUNTER — Encounter: Payer: Self-pay | Admitting: Podiatry

## 2022-08-23 ENCOUNTER — Encounter: Payer: Self-pay | Admitting: Sports Medicine

## 2022-08-23 ENCOUNTER — Ambulatory Visit: Payer: Medicare HMO | Admitting: Sports Medicine

## 2022-08-23 ENCOUNTER — Ambulatory Visit: Payer: Self-pay

## 2022-08-23 DIAGNOSIS — M25561 Pain in right knee: Secondary | ICD-10-CM

## 2022-08-23 DIAGNOSIS — M25461 Effusion, right knee: Secondary | ICD-10-CM | POA: Diagnosis not present

## 2022-08-23 DIAGNOSIS — G8929 Other chronic pain: Secondary | ICD-10-CM

## 2022-08-23 MED ORDER — BUPIVACAINE HCL 0.25 % IJ SOLN
1.0000 mL | INTRAMUSCULAR | Status: AC | PRN
  Administered 2022-08-23: 1 mL via INTRA_ARTICULAR

## 2022-08-23 MED ORDER — LIDOCAINE HCL 1 % IJ SOLN
5.0000 mL | INTRAMUSCULAR | Status: AC | PRN
  Administered 2022-08-23: 5 mL

## 2022-08-23 MED ORDER — METHYLPREDNISOLONE ACETATE 40 MG/ML IJ SUSP
80.0000 mg | INTRAMUSCULAR | Status: AC | PRN
  Administered 2022-08-23: 80 mg via INTRA_ARTICULAR

## 2022-08-23 NOTE — Progress Notes (Signed)
Howard Davenport - 76 y.o. male MRN [id number]  Date of birth: 06-30-1946  Office Visit Note: Visit Date: 08/23/2022 PCP: Susy Frizzle, MD Referred by: Susy Frizzle, MD  Subjective: Chief Complaint  Patient presents with   Right Knee - Pain, Follow-up   HPI: Howard Davenport is a pleasant 76 y.o. male who presents today for follow-up of right knee pain.  Still continues with some right knee pain.  Also noticed some swelling is in that knee.  He has been taking the meloxicam, trying to rest and take it easy although the pain and swelling still persists.  It is not a terribly sharp or bothersome pain, although feels like he has been slow to get back to normal activity.  Continues with swelling in the knee.  He is hopeful to get something that will quicken his recovery.  He does have some yard work today and this is inhibiting that.  Brought his book with him today, "We will not be silenced."  Pertinent ROS were reviewed with the patient and found to be negative unless otherwise specified above in HPI.   Assessment & Plan: Visit Diagnoses:  1. Chronic pain of right knee   2. Effusion, right knee    Plan: Discussed all treatment options with Leane Para today regarding his knee pain and effusion.  He is making only slow progress with anti-inflammatories, ice and rest.  Through shared decision making, elected to proceed with aspiration and injection into the knee which she tolerated well and had significant relief following this procedure.  Did recommend a neoprene compression sleeve to help control swelling.  He will rest for the next 2-3 days and then return to activity as needed.  If he has a recurrence of his effusion, we can always consider subsequent aspiration.  Would not anticipate this becoming a recurring process, although if it is we may need advanced imaging such as MRI.  He will follow-up as needed or return if the swelling and pain returns.  Continue meloxicam to be taken only when  pain is present, otherwise as needed.  Follow-up: Return if symptoms worsen or fail to improve.   Meds & Orders: No orders of the defined types were placed in this encounter.   Orders Placed This Encounter  Procedures   Large Joint Inj   [place] Extrem Low Right Ltd    Procedures: Large Joint Inj: R knee on 08/23/2022 11:35 AM Details: 22 G 1.5 in needle, ultrasound-guided anterolateral approach Medications: 5 mL lidocaine 1 %; 1 mL bupivacaine 0.25 %; 80 mg methylPREDNISolone acetate 40 MG/ML Aspirate: 42 mL yellow and clear Outcome: tolerated well, no immediate complications  US-guided Knee Aspiration, Left: After discussion on risks/benefits/indications was provided, informed verbal consent was obtained and a timeout was performed, patient was lying supine on exam table. The knee was prepped with alcohol swab.  Utilizing superolateral approach, approximately 5 mL of lidocaine 1% was used for local anesthesia. Then using an 18g needle on 60cc syringe, approximately 42 mL of clear straw-colored fluid was aspirated from the knee. Utilizing the same portal, the knee joint was then injected with 1:2 bupivicaine:depomedrol.  Patient tolerated procedure well without immediate complications.  Procedure, treatment alternatives, risks and benefits explained, specific risks discussed. Consent was given by the patient. Immediately prior to procedure a time out was called to verify the correct patient, procedure, equipment, support staff and site/side marked as required. Patient was prepped and draped in the usual sterile fashion.  Clinical History: No specialty comments available.  He reports that he has never smoked. He has never used smokeless tobacco. No results for input(s): "HGBA1C", "LABURIC" in the last 8760 hours.  Objective:    Physical Exam  Gen: Well-appearing, in no acute distress; non-toxic CV: Regular Rate. Well-perfused. Warm.  Resp: Breathing unlabored on room air; no  wheezing. Psych: Fluid speech in conversation; appropriate affect; normal thought process Neuro: Sensation intact throughout. No gross coordination deficits.   Ortho Exam -Right knee: Evaluation of the right knee does show a moderate effusion.  There is some mild warmth associated with the knee joint without redness or erythema.  TTP over the medial joint line.  Mild crepitation of the knee with flexion and extension.  Range of motion 0-120 degrees with mild restriction pain at endrange flexion.  Positive McMurray's testing on the medial side.  No varus or valgus instability.  Strength 5/5 with knee flexion and extension.  Imaging: [place] Extrem Low Right Ltd  Result Date: 08/23/2022 Limited ultrasound of the right knee shows a moderate to large knee effusion, more so in the suprapatellar pouch.  Ultrasound shows technically successful knee aspiration and injection.      Past Medical/Family/Surgical/Social History: Medications & Allergies reviewed per EMR, new medications updated. Patient Active Problem List   Diagnosis Date Noted   Pulmonary emboli [facility])    Ascending aortic aneurysm (HCC)    Colon polyp    Vasovagal syncope 02/12/2014   Near syncope 01/30/2014   GERD (gastroesophageal reflux disease)    PVD 12/16/2009   PALPITATIONS 12/12/2009   CHEST PAIN 12/12/2009   Past Medical History:  Diagnosis Date   Ascending aortic aneurysm (HCC)    4 cm 2019   Colon polyp    tubular adenoma 01/2018   GERD (gastroesophageal reflux disease)    Hiatal hernia    Inguinal hernia    Pulmonary emboli (HCC)    Pulmonary emboli (HCC)    Family History  Problem Relation Age of Onset   Leukemia Mother    Heart disease Father    Past Surgical History:  Procedure Laterality Date   HERNIA REPAIR     Social History   Occupational History   Not on file  Tobacco Use   Smoking status: Never   Smokeless tobacco: Never  Substance and Sexual Activity   Alcohol use: No   Drug use: No    Sexual activity: Yes

## 2022-08-23 NOTE — Progress Notes (Signed)
Follow up on right knee; Still having some pain but not unbearable Meloxicam for pain; unsure if it is helping

## 2022-08-25 ENCOUNTER — Telehealth: Payer: Self-pay | Admitting: Family Medicine

## 2022-08-25 NOTE — Telephone Encounter (Signed)
Left message to return call.  Patient needs to schedule [facility] appt with Dr. Dennard Schaumann.

## 2022-09-03 ENCOUNTER — Ambulatory Visit: Payer: Medicare HMO | Admitting: Podiatry

## 2022-09-07 ENCOUNTER — Encounter: Payer: Self-pay | Admitting: Family Medicine

## 2022-09-07 ENCOUNTER — Ambulatory Visit (INDEPENDENT_AMBULATORY_CARE_PROVIDER_SITE_OTHER): Payer: Medicare HMO | Admitting: Family Medicine

## 2022-09-07 VITALS — BP 116/64 | HR 65 | Ht 70.0 in | Wt 220.0 lb

## 2022-09-07 DIAGNOSIS — Z0000 Encounter for general adult medical examination without abnormal findings: Secondary | ICD-10-CM

## 2022-09-07 DIAGNOSIS — Z125 Encounter for screening for malignant neoplasm of prostate: Secondary | ICD-10-CM

## 2022-09-07 DIAGNOSIS — Z13220 Encounter for screening for lipoid disorders: Secondary | ICD-10-CM

## 2022-09-07 DIAGNOSIS — Z86711 Personal history of pulmonary embolism: Secondary | ICD-10-CM | POA: Diagnosis not present

## 2022-09-07 MED ORDER — TAMSULOSIN HCL 0.4 MG PO CAPS: 0.4000 mg | ORAL_CAPSULE | Freq: Every day | ORAL | 3 refills | Status: AC

## 2022-09-07 NOTE — Progress Notes (Signed)
Subjective:    Patient ID: Howard Davenport, male    DOB: [birth year], 76 y.o.   MRN: [id number]  HPI  Patient is a very pleasant 76 year old Caucasian gentleman here today for complete physical exam.  He denies any concerns.  He is due for a flu shot, shingles vaccine, COVID booster, and an RSV vaccine.  We discussed the risk and benefits of all these.  He has a history of tubular adenomas.  His last colonoscopy was in 2019 and they recommended a repeat colonoscopy in 3 to 5 years.  He defers that until next year.  He is due for prostate cancer screening.  He is also due for fasting lab work. Past Medical History:  Diagnosis Date   Ascending aortic aneurysm ([place])    4 cm 2019   Colon polyp    tubular adenoma 01/2018   GERD (gastroesophageal reflux disease)    Hiatal hernia    Inguinal hernia    Pulmonary emboli (HCC)    Pulmonary emboli (HCC)    Past Surgical History:  Procedure Laterality Date   HERNIA REPAIR     Current Outpatient Medications on File Prior to Visit  Medication Sig Dispense Refill   albuterol (VENTOLIN HFA) 108 (90 Base) MCG/ACT inhaler TAKE 2 PUFFS BY MOUTH EVERY 6 HOURS AS NEEDED FOR WHEEZE OR SHORTNESS OF BREATH 8.5 each 2   fluticasone (FLONASE) 50 MCG/ACT nasal spray Place 2 sprays into both nostrils daily. 16 g 6   meloxicam (MOBIC) 15 MG tablet Take 1 tablet (15 mg total) by mouth daily. 30 tablet 1   omeprazole (PRILOSEC) 20 MG capsule Take 1 capsule (20 mg total) by mouth daily. 90 capsule 0   Probiotic Product (PROBIOTIC DAILY PO) Take 1 tablet by mouth daily.     vitamin E 400 UNIT capsule Take 400 Units by mouth daily.     No current facility-administered medications on file prior to visit.   No Known Allergies Social History   Socioeconomic History   Marital status: Married    Spouse name: Not on file   Number of children: Not on file   Years of education: Not on file   Highest education level: Not on file  Occupational History   Not on file   Tobacco Use   Smoking status: Never   Smokeless tobacco: Never  Substance and Sexual Activity   Alcohol use: No   Drug use: No   Sexual activity: Yes  Other Topics Concern   Not on file  Social History Narrative   Married with 2 children   Right handed   12 th    3 cups daily   Social Determinants of Health   Financial Resource Strain: Not on file  Food Insecurity: Not on file  Transportation Needs: Not on file  Physical Activity: Not on file  Stress: Not on file  Social Connections: Not on file  Intimate Partner Violence: Not on file      Review of Systems  All other systems reviewed and are negative.      Objective:   Physical Exam Constitutional:      General: He is not in acute distress.    Appearance: Normal appearance. He is not ill-appearing, toxic-appearing or diaphoretic.  HENT:     Head: Normocephalic and atraumatic.     Right Ear: Tympanic membrane and ear canal normal. There is no impacted cerumen.     Left Ear: Tympanic membrane and ear canal normal. There is no  impacted cerumen.     Nose: Nose normal. No congestion or rhinorrhea.     Mouth/Throat:     Mouth: Mucous membranes are moist.     Pharynx: Oropharynx is clear. No oropharyngeal exudate or posterior oropharyngeal erythema.  Eyes:     Extraocular Movements: Extraocular movements intact.     Conjunctiva/sclera: Conjunctivae normal.     Pupils: Pupils are equal, round, and reactive to light.  Neck:     Vascular: No carotid bruit.  Cardiovascular:     Rate and Rhythm: Normal rate and regular rhythm.     Pulses: Normal pulses.     Heart sounds: Normal heart sounds. No murmur heard.    No friction rub. No gallop.  Pulmonary:     Effort: Pulmonary effort is normal. No respiratory distress.     Breath sounds: Normal breath sounds and air entry. No stridor, decreased air movement or transmitted upper airway sounds. No decreased breath sounds, wheezing, rhonchi or rales.  Chest:     Chest  wall: No deformity, swelling, tenderness, crepitus or edema. There is no dullness to percussion.  Abdominal:     General: Abdomen is flat. Bowel sounds are normal. There is no distension.     Palpations: Abdomen is soft.     Tenderness: There is no abdominal tenderness. There is no guarding or rebound.     Hernia: A hernia is present.  Genitourinary:    Penis: Normal.      Testes: Normal.  Musculoskeletal:        General: No swelling, tenderness, deformity or signs of injury.     Cervical back: Normal range of motion and neck supple. No rigidity.     Right lower leg: No edema.     Left lower leg: No edema.  Lymphadenopathy:     Cervical: No cervical adenopathy.  Skin:    Coloration: Skin is not jaundiced or pale.     Findings: No bruising, erythema, lesion or rash.  Neurological:     General: No focal deficit present.     Mental Status: He is alert and oriented to person, place, and time. Mental status is at baseline.     Cranial Nerves: No cranial nerve deficit.     Motor: No weakness.     Gait: Gait normal.     Deep Tendon Reflexes: Reflexes normal.  Psychiatric:        Mood and Affect: Mood normal.        Behavior: Behavior normal.        Thought Content: Thought content normal.        Judgment: Judgment normal.           Assessment & Plan:  General medical exam  History of pulmonary embolism Patient received a flu shot today.  I recommended the shingles vaccine as well as a COVID booster.  He has grandchildren under a year of age so we also discussed the benefit of an RSV vaccine.  Patient defers colonoscopy until next year.  I have asked him to return fasting for CBC a CMP and lipid panel and PSA.  He denies any falls or depression or memory loss

## 2022-09-08 ENCOUNTER — Other Ambulatory Visit: Payer: Medicare HMO

## 2022-09-08 DIAGNOSIS — R7309 Other abnormal glucose: Secondary | ICD-10-CM | POA: Diagnosis not present

## 2022-09-08 DIAGNOSIS — Z136 Encounter for screening for cardiovascular disorders: Secondary | ICD-10-CM | POA: Diagnosis not present

## 2022-09-08 DIAGNOSIS — Z13220 Encounter for screening for lipoid disorders: Secondary | ICD-10-CM | POA: Diagnosis not present

## 2022-09-08 DIAGNOSIS — Z125 Encounter for screening for malignant neoplasm of prostate: Secondary | ICD-10-CM | POA: Diagnosis not present

## 2022-09-09 ENCOUNTER — Other Ambulatory Visit: Payer: Self-pay

## 2022-09-09 DIAGNOSIS — E785 Hyperlipidemia, unspecified: Secondary | ICD-10-CM

## 2022-09-09 MED ORDER — ROSUVASTATIN CALCIUM 10 MG PO TABS: 10.0000 mg | ORAL_TABLET | Freq: Every day | ORAL | 3 refills | Status: DC

## 2022-09-10 LAB — LIPID PANEL
Cholesterol: 236 mg/dL — ABNORMAL HIGH (ref <200)
HDL: 63 mg/dL (ref >=40)
LDL Cholesterol (Calc): 145 mg/dL (calc) — ABNORMAL HIGH
Non-HDL Cholesterol (Calc): 173 mg/dL (calc) — ABNORMAL HIGH (ref <130)
Total CHOL/HDL Ratio: 3.7 (calc) (ref <5.0)
Triglycerides: 146 mg/dL (ref <150)

## 2022-09-10 LAB — TEST AUTHORIZATION

## 2022-09-10 LAB — CBC WITH DIFFERENTIAL/PLATELET
Absolute Monocytes: 762 cells/uL (ref 200–950)
Basophils Absolute: 52 cells/uL (ref 0–200)
Basophils Relative: 0.7 %
Eosinophils Absolute: 118 cells/uL (ref 15–500)
Eosinophils Relative: 1.6 %
HCT: 49.8 % (ref 38.5–50.0)
Hemoglobin: 16.6 g/dL (ref 13.2–17.1)
Lymphs Abs: 1939 cells/uL (ref 850–3900)
MCH: 30.9 pg (ref 27.0–33.0)
MCHC: 33.3 g/dL (ref 32.0–36.0)
MCV: 92.7 fL (ref 80.0–100.0)
MPV: 10.3 fL (ref 7.5–12.5)
Monocytes Relative: 10.3 %
Neutro Abs: 4529 cells/uL (ref 1500–7800)
Neutrophils Relative %: 61.2 %
Platelets: 175 10*3/uL (ref 140–400)
RBC: 5.37 10*6/uL (ref 4.20–5.80)
RDW: 12.9 % (ref 11.0–15.0)
Total Lymphocyte: 26.2 %
WBC: 7.4 10*3/uL (ref 3.8–10.8)

## 2022-09-10 LAB — COMPLETE METABOLIC PANEL WITH GFR
AG Ratio: 1.4 (calc) (ref 1.0–2.5)
ALT: 10 U/L (ref 9–46)
AST: 13 U/L (ref 10–35)
Albumin: 4.2 g/dL (ref 3.6–5.1)
Alkaline phosphatase (APISO): 61 U/L (ref 35–144)
BUN: 18 mg/dL (ref 7–25)
CO2: 26 mmol/L (ref 20–32)
Calcium: 9.6 mg/dL (ref 8.6–10.3)
Chloride: 103 mmol/L (ref 98–110)
Creat: 1.16 mg/dL (ref 0.70–1.28)
Globulin: 2.9 g/dL (calc) (ref 1.9–3.7)
Glucose, Bld: 122 mg/dL — ABNORMAL HIGH (ref 65–99)
Potassium: 5.8 mmol/L — ABNORMAL HIGH (ref 3.5–5.3)
Sodium: 141 mmol/L (ref 135–146)
Total Bilirubin: 0.6 mg/dL (ref 0.2–1.2)
Total Protein: 7.1 g/dL (ref 6.1–8.1)
eGFR: 65 mL/min/{1.73_m2} (ref >=60)

## 2022-09-10 LAB — HEMOGLOBIN A1C W/OUT EAG: Hgb A1c MFr Bld: 6.4 % of total Hgb — ABNORMAL HIGH (ref <5.7)

## 2022-09-10 LAB — PSA: PSA: 2.74 ng/mL (ref <=4.00)

## 2022-10-03 ENCOUNTER — Other Ambulatory Visit: Payer: Self-pay | Admitting: Sports Medicine

## 2022-10-20 ENCOUNTER — Ambulatory Visit: Payer: Medicare HMO | Admitting: Surgery

## 2022-10-20 ENCOUNTER — Encounter: Payer: Self-pay | Admitting: Surgery

## 2022-10-20 DIAGNOSIS — M25561 Pain in right knee: Secondary | ICD-10-CM | POA: Diagnosis not present

## 2022-10-20 DIAGNOSIS — M1711 Unilateral primary osteoarthritis, right knee: Secondary | ICD-10-CM

## 2022-10-20 DIAGNOSIS — M25461 Effusion, right knee: Secondary | ICD-10-CM | POA: Diagnosis not present

## 2022-10-20 NOTE — Progress Notes (Signed)
Office Visit Note   Patient: VENICE LIZ           Date of Birth: 02/18/1946           MRN: [id number] Visit Date: 10/20/2022              Requested by: Susy Frizzle, MD [address] [place],  [place] [address] PCP: Susy Frizzle, MD   Assessment & Plan: Visit Diagnoses:  1. Mechanical knee pain, right   2. Pain and swelling of right knee   3. Arthritis of right knee     Plan: with patients ongoing symptoms and failed conservative treatment I will schedule right knee mri as previously discussed with Dr Rolena Infante.  Patient will follow up with Dr Marlou Sa in 3 weeks to review results and discuss treatment options.    Follow-Up Instructions: Return in about 3 weeks (around 11/10/2022) for WITH DR Marlou Sa TO REVIEW RIGHT KNEE MRI AND DISCUSS TREATMENT OPTIONS..   Orders:  Orders Placed This Encounter  Procedures   MR Knee Right w/o contrast   No orders of the defined types were placed in this encounter.     Procedures: No procedures performed   Clinical Data: No additional findings.   Subjective: Chief Complaint  Patient presents with   Right Knee - Pain    HPI 76 yo wm returns for recheck of right knee pain. States that previous injection performed by Dr Rolena Infante only gave relief for a couple of days.  Continues to have ongoing pain with activity more in the medial knee with some feeling of mechanical symptoms.   Review of Systems No current c/o cardiac, pulm, gi, gu issues.   Objective: Vital Signs: There were no vitals taken for this visit.  Physical Exam HENT:     Head: Normocephalic and atraumatic.  Eyes:     Extraocular Movements: Extraocular movements intact.  Pulmonary:     Effort: No respiratory distress.  Musculoskeletal:     Comments: Right knee some swelling without large effusion.  Good knee ROM.  Mild PF crepitus.  Medial joint line tender.  Ligaments are stable.   Neurological:     Mental Status: He is alert.  Psychiatric:         Mood and Affect: Mood normal.     Ortho Exam  Specialty Comments:  No specialty comments available.  Imaging: No results found.   PMFS History: Patient Active Problem List   Diagnosis Date Noted   Pulmonary emboli ([place])    Ascending aortic aneurysm (HCC)    Colon polyp    Vasovagal syncope 02/12/2014   Near syncope 01/30/2014   GERD (gastroesophageal reflux disease)    PVD 12/16/2009   PALPITATIONS 12/12/2009   CHEST PAIN 12/12/2009   Past Medical History:  Diagnosis Date   Ascending aortic aneurysm (HCC)    4 cm 2019   Colon polyp    tubular adenoma 01/2018   GERD (gastroesophageal reflux disease)    Hiatal hernia    Inguinal hernia    Pulmonary emboli (HCC)    Pulmonary emboli (HCC)     Family History  Problem Relation Age of Onset   Leukemia Mother    Heart disease Father     Past Surgical History:  Procedure Laterality Date   HERNIA REPAIR     Social History   Occupational History   Not on file  Tobacco Use   Smoking status: Never   Smokeless tobacco: Never  Substance and Sexual Activity   Alcohol use: No   Drug use: No   Sexual activity: Yes

## 2022-11-03 ENCOUNTER — Other Ambulatory Visit: Payer: Self-pay

## 2022-11-03 ENCOUNTER — Other Ambulatory Visit: Payer: Self-pay | Admitting: Family Medicine

## 2022-11-03 DIAGNOSIS — R0600 Dyspnea, unspecified: Secondary | ICD-10-CM

## 2022-11-03 MED ORDER — ALBUTEROL SULFATE HFA 108 (90 BASE) MCG/ACT IN AERS: INHALATION_SPRAY | RESPIRATORY_TRACT | 2 refills | Status: DC

## 2022-11-03 NOTE — Telephone Encounter (Signed)
PT NEED CPE APPT W/PCP FOR FUTURE REFILLS  

## 2022-11-05 NOTE — Telephone Encounter (Signed)
Patient already had his annual CPE on 09/07/22.

## 2022-11-11 DIAGNOSIS — H52223 Regular astigmatism, bilateral: Secondary | ICD-10-CM | POA: Diagnosis not present

## 2022-11-11 DIAGNOSIS — H5203 Hypermetropia, bilateral: Secondary | ICD-10-CM | POA: Diagnosis not present

## 2022-11-18 ENCOUNTER — Ambulatory Visit: Payer: Medicare Other | Admitting: Orthopedic Surgery

## 2022-11-18 DIAGNOSIS — M1711 Unilateral primary osteoarthritis, right knee: Secondary | ICD-10-CM

## 2022-11-20 ENCOUNTER — Encounter: Payer: Self-pay | Admitting: Orthopedic Surgery

## 2022-11-20 MED ORDER — BUPIVACAINE HCL 0.25 % IJ SOLN
4.0000 mL | INTRAMUSCULAR | Status: AC | PRN
  Administered 2022-11-18: 4 mL via INTRA_ARTICULAR

## 2022-11-20 MED ORDER — LIDOCAINE HCL 1 % IJ SOLN
5.0000 mL | INTRAMUSCULAR | Status: AC | PRN
  Administered 2022-11-18: 5 mL

## 2022-11-20 MED ORDER — METHYLPREDNISOLONE ACETATE 40 MG/ML IJ SUSP
40.0000 mg | INTRAMUSCULAR | Status: AC | PRN
  Administered 2022-11-18: 40 mg via INTRA_ARTICULAR

## 2022-11-20 NOTE — Progress Notes (Signed)
Office Visit Note   Patient: Howard Davenport           Date of Birth: [birth year]           MRN: [id number] Visit Date: 11/18/2022 Requested by: Susy Frizzle, MD [address] [place],  [place] [address] PCP: Susy Frizzle, MD  Subjective: Chief Complaint  Patient presents with   Right Knee - Pain    HPI: Howard Davenport is a 77 y.o. male who presents to the office reporting right knee pain for several months.  Started when his gait was off.  Felt like it may have been due to plantar fasciitis.  Describes swelling but no weakness or giving way.  Some stiffness after prolonged rest.  Pain does not wake him from sleep at night.  No prior surgery.  No medications for pain.  Tried meloxicam without much relief.  Has had prior injection which helped him for 2 days and that was about 2 months ago.  Was sent for MRI scan but that has not been done..                ROS: All systems reviewed are negative as they relate to the chief complaint within the history of present illness.  Patient denies fevers or chills.  Assessment & Plan: Visit Diagnoses: No diagnosis found.  Plan: Impression is right knee pain with arthritis.  And medial joint line tenderness with primarily medial compartment arthritis.  Repeat aspiration and injection of the knee is performed today.  He may or may not require intervention in the future.  Plan to see him back in 6 to 8 weeks for repeat assessment.  Injection performed today without complication  Follow-Up Instructions: No follow-ups on file.   Orders:  No orders of the defined types were placed in this encounter.  No orders of the defined types were placed in this encounter.     Procedures: Large Joint Inj: R knee on 11/18/2022 6:37 PM Indications: diagnostic evaluation, joint swelling and pain Details: 18 G 1.5 in needle, superolateral approach  Arthrogram: No  Medications: 5 mL lidocaine 1 %; 40 mg methylPREDNISolone acetate 40 MG/ML; 4 mL  bupivacaine 0.25 % Outcome: tolerated well, no immediate complications Procedure, treatment alternatives, risks and benefits explained, specific risks discussed. Consent was given by the patient. Immediately prior to procedure a time out was called to verify the correct patient, procedure, equipment, support staff and site/side marked as required. Patient was prepped and draped in the usual sterile fashion.       Clinical Data: No additional findings.  Objective: Vital Signs: There were no vitals taken for this visit.  Physical Exam:  Constitutional: Patient appears well-developed HEENT:  Head: Normocephalic Eyes:EOM are normal Neck: Normal range of motion Cardiovascular: Normal rate Pulmonary/chest: Effort normal Neurologic: Patient is alert Skin: Skin is warm Psychiatric: Patient has normal mood and affect  Ortho Exam: Ortho exam demonstrates slightly antalgic gait to the right.  Pedal pulses palpable.  Has pretty good range of motion of 5-1 20 with stable collateral cruciate ligaments.  Extensor mechanism intact.  No masses lymphadenopathy or skin changes noted in the right knee region.  Has medial greater than lateral joint line tenderness.  Mild to moderate effusion is present.  Specialty Comments:  No specialty comments available.  Imaging: No results found.   PMFS History: Patient Active Problem List   Diagnosis Date Noted   Pulmonary emboli ([place])    Ascending  aortic aneurysm (HCC)    Colon polyp    Vasovagal syncope 02/12/2014   Near syncope 01/30/2014   GERD (gastroesophageal reflux disease)    PVD 12/16/2009   PALPITATIONS 12/12/2009   CHEST PAIN 12/12/2009   Past Medical History:  Diagnosis Date   Ascending aortic aneurysm (HCC)    4 cm 2019   Colon polyp    tubular adenoma 01/2018   GERD (gastroesophageal reflux disease)    Hiatal hernia    Inguinal hernia    Pulmonary emboli (HCC)    Pulmonary emboli (HCC)     Family History  Problem Relation  Age of Onset   Leukemia Mother    Heart disease Father     Past Surgical History:  Procedure Laterality Date   HERNIA REPAIR     Social History   Occupational History   Not on file  Tobacco Use   Smoking status: Never   Smokeless tobacco: Never  Substance and Sexual Activity   Alcohol use: No   Drug use: No   Sexual activity: Yes

## 2022-11-26 ENCOUNTER — Telehealth: Payer: Self-pay | Admitting: Family Medicine

## 2022-11-26 NOTE — Telephone Encounter (Signed)
Patient requesting call back; has questions about how often he should receive a scan to check on the aneurism.  He also wants to know who Dr. Dennard Schaumann would recommend to do his hernia surgery.  Please advise at [phone]. Please leave a message if he doesn't answer (bad reception at his job).

## 2022-11-29 ENCOUNTER — Other Ambulatory Visit: Payer: Self-pay | Admitting: Family Medicine

## 2022-11-29 DIAGNOSIS — I7121 Aneurysm of the ascending aorta, without rupture: Secondary | ICD-10-CM

## 2022-12-03 ENCOUNTER — Other Ambulatory Visit: Payer: Self-pay | Admitting: Family Medicine

## 2022-12-03 NOTE — Telephone Encounter (Signed)
Call to pharmacy- patient has 30 day supply left- will fill Requested Prescriptions  Pending Prescriptions Disp Refills   tamsulosin (FLOMAX) 0.4 MG CAPS capsule [Pharmacy Med Name: TAMSULOSIN HCL 0.4 MG CAPSULE] 90 capsule 1    Sig: TAKE 1 [place]     Urology: Alpha-Adrenergic Blocker Failed - 12/03/2022  1:32 AM      Failed - Valid encounter within last 12 months    Recent Outpatient Visits           1 year ago History of pulmonary embolism   [place] Pickard, Cammie Mcgee, MD   1 year ago Dog bite, initial encounter   [place] Pickard, Cammie Mcgee, MD   1 year ago Dog bite, initial encounter   [place] Eulogio Bear, NP   1 year ago Mild intermittent asthma with acute exacerbation   [place] Pick[facility], Cammie Mcgee, MD   1 year ago Pulmonary embolism and infarction [facility])   [place] Pickard, Cammie Mcgee, MD       Future Appointments             In 6 days Susy Frizzle, MD [facility]            Passed - PSA in normal range and within 360 days    PSA  Date Value Ref Range Status  09/08/2022 2.74 < OR = 4.00 ng/mL Final    Comment:    The total PSA value from this assay system is  standardized against the WHO standard. The test  result will be approximately 20% lower when compared  to the equimolar-standardized total PSA (Beckman  Coulter). Comparison of serial PSA results should be  interpreted with this fact in mind. . This test was performed using the Siemens  chemiluminescent method. Values obtained from  different assay methods cannot be used interchangeably. PSA levels, regardless of value, should not be interpreted as absolute evidence of the presence or absence of disease.          Passed - Last BP in normal range    BP Readings from Last 1 Encounters:  09/07/22 116/64

## 2022-12-09 ENCOUNTER — Encounter: Payer: Self-pay | Admitting: Family Medicine

## 2022-12-09 ENCOUNTER — Ambulatory Visit (INDEPENDENT_AMBULATORY_CARE_PROVIDER_SITE_OTHER): Payer: Medicare Other | Admitting: Family Medicine

## 2022-12-09 VITALS — BP 126/72 | HR 60 | Temp 98.7°F | Ht 70.0 in | Wt 221.0 lb

## 2022-12-09 DIAGNOSIS — E785 Hyperlipidemia, unspecified: Secondary | ICD-10-CM | POA: Diagnosis not present

## 2022-12-09 DIAGNOSIS — R7303 Prediabetes: Secondary | ICD-10-CM

## 2022-12-09 MED ORDER — ALBUTEROL SULFATE HFA 108 (90 BASE) MCG/ACT IN AERS: 2.0000 | INHALATION_SPRAY | Freq: Four times a day (QID) | RESPIRATORY_TRACT | 1 refills | Status: DC | PRN

## 2022-12-09 NOTE — Progress Notes (Signed)
Subjective:    Patient ID: Howard Davenport, male    DOB: 1946/10/12, 77 y.o.   MRN: [id number]  HPI  The patient's recent complete physical exam, he was found to have a hemoglobin A1c of 6.4 as well as elevated cholesterol. Office Visit on 09/07/2022  Component Date Value Ref Range Status   WBC 09/08/2022 7.4  3.8 - 10.8 Thousand/uL Final   RBC 09/08/2022 5.37  4.20 - 5.80 Million/uL Final   Hemoglobin 09/08/2022 16.6  13.2 - 17.1 g/dL Final   HCT 09/08/2022 49.8  38.5 - 50.0 % Final   MCV 09/08/2022 92.7  80.0 - 100.0 fL Final   MCH 09/08/2022 30.9  27.0 - 33.0 pg Final   MCHC 09/08/2022 33.3  32.0 - 36.0 g/dL Final   RDW 09/08/2022 12.9  11.0 - 15.0 % Final   Platelets 09/08/2022 175  140 - 400 Thousand/uL Final   MPV 09/08/2022 10.3  7.5 - 12.5 fL Final   Neutro Abs 09/08/2022 4,529  1,500 - 7,800 cells/uL Final   Lymphs Abs 09/08/2022 1,939  850 - 3,900 cells/uL Final   Absolute Monocytes 09/08/2022 762  200 - 950 cells/uL Final   Eosinophils Absolute 09/08/2022 118  15 - 500 cells/uL Final   Basophils Absolute 09/08/2022 52  0 - 200 cells/uL Final   Neutrophils Relative % 09/08/2022 61.2  % Final   Total Lymphocyte 09/08/2022 26.2  % Final   Monocytes Relative 09/08/2022 10.3  % Final   Eosinophils Relative 09/08/2022 1.6  % Final   Basophils Relative 09/08/2022 0.7  % Final   Glucose, Bld 09/08/2022 122 (H)  65 - 99 mg/dL Final   Comment: .            Fasting reference interval . For someone without known diabetes, a glucose value between 100 and 125 mg/dL is consistent with prediabetes and should be confirmed with a follow-up test. .    BUN 09/08/2022 18  7 - 25 mg/dL Final   Creat 09/08/2022 1.16  0.70 - 1.28 mg/dL Final   eGFR 09/08/2022 65  > OR = 60 mL/min/1.82m Final   BUN/Creatinine Ratio 09/08/2022 SEE NOTE:  6 - 22 (calc) Final   Comment:    Not Reported: BUN and Creatinine are within    reference range. .    Sodium 09/08/2022 141  135 - 146 mmol/L Final    Potassium 09/08/2022 5.8 (H)  3.5 - 5.3 mmol/L Final   Chloride 09/08/2022 103  98 - 110 mmol/L Final   CO2 09/08/2022 26  20 - 32 mmol/L Final   Calcium 09/08/2022 9.6  8.6 - 10.3 mg/dL Final   Total Protein 09/08/2022 7.1  6.1 - 8.1 g/dL Final   Albumin 09/08/2022 4.2  3.6 - 5.1 g/dL Final   Globulin 09/08/2022 2.9  1.9 - 3.7 g/dL (calc) Final   AG Ratio 09/08/2022 1.4  1.0 - 2.5 (calc) Final   Total Bilirubin 09/08/2022 0.6  0.2 - 1.2 mg/dL Final   Alkaline phosphatase (APISO) 09/08/2022 61  35 - 144 U/L Final   AST 09/08/2022 13  10 - 35 U/L Final   ALT 09/08/2022 10  9 - 46 U/L Final   Cholesterol 09/08/2022 236 (H)  <200 mg/dL Final   HDL 09/08/2022 63  > OR = 40 mg/dL Final   Triglycerides 09/08/2022 146  <150 mg/dL Final   LDL Cholesterol (Calc) 09/08/2022 145 (H)  mg/dL (calc) Final   Comment: Reference range: <100 .  Desirable range <100 mg/dL for primary prevention;   <70 mg/dL for patients with CHD or diabetic patients  with > or = 2 CHD risk factors. Marland Kitchen LDL-C is now calculated using the Martin-Hopkins  calculation, which is a validated novel method providing  better accuracy than the Friedewald equation in the  estimation of LDL-C.  Cresenciano Genre et al. Annamaria Helling. 0623;762(83): 2061-2068  (http://education.[facility].com/faq/FAQ164)    Total CHOL/HDL Ratio 09/08/2022 3.7  <5.0 (calc) Final   Non-HDL Cholesterol (Calc) 09/08/2022 173 (H)  <130 mg/dL (calc) Final   Comment: For patients with diabetes plus 1 major ASCVD risk  factor, treating to a non-HDL-C goal of <100 mg/dL  (LDL-C of <70 mg/dL) is considered a therapeutic  option.    PSA 09/08/2022 2.74  < OR = 4.00 ng/mL Final   Comment: The total PSA value from this assay system is  standardized against the WHO standard. The test  result will be approximately 20% lower when compared  to the equimolar-standardized total PSA (Beckman  Coulter). Comparison of serial PSA results should be  interpreted with this fact  in mind. . This test was performed using the Siemens  chemiluminescent method. Values obtained from  different assay methods cannot be used interchangeably. PSA levels, regardless of value, should not be interpreted as absolute evidence of the presence or absence of disease.    Hgb A1c MFr Bld 09/08/2022 6.4 (H)  <5.7 % of total Hgb Final   Comment: For someone without known diabetes, a hemoglobin  A1c value between 5.7% and 6.4% is consistent with prediabetes and should be confirmed with a  follow-up test. . For someone with known diabetes, a value <7% indicates that their diabetes is well controlled. A1c targets should be individualized based on duration of diabetes, age, comorbid conditions, and other considerations. . This assay result is consistent with an increased risk of diabetes. . Currently, no consensus exists regarding use of hemoglobin A1c for diagnosis of diabetes for children. .    TEST NAME: 09/08/2022 HEMOGLOBIN A1c   Final   TEST CODE: 09/08/2022 [id number]   Final   CLIENT CONTACT: 09/08/2022 Learta Codding   Final   REPORT ALWAYS MESSAGE SIGNATURE 09/08/2022    Final   Comment: . The laboratory testing on this patient was verbally requested or confirmed by the ordering physician or his or her authorized representative after contact with an employee of [employer]. Federal regulations require that we maintain on file written authorization for all laboratory testing.  Accordingly we are asking that the ordering physician or his or her authorized representative sign a copy of this report and promptly return it to the client service representative. . . Signature:____________________________________________________ . Please fax this signed page to [phone] or return it via your [employer] courier.    He is here today to recheck his labs.  He has tried to reduce his consumption of sugar.  He denies any polyuria polydipsia or blurry vision. Past  Medical History:  Diagnosis Date   Ascending aortic aneurysm ([place])    4 cm 2019   Colon polyp    tubular adenoma 01/2018   GERD (gastroesophageal reflux disease)    Hiatal hernia    Inguinal hernia    Pulmonary emboli ([facility])    Pulmonary emboli ([facility])    Past Surgical History:  Procedure Laterality Date   HERNIA REPAIR     Current Outpatient Medications on File Prior to Visit  Medication Sig Dispense Refill   albuterol (VENTOLIN HFA)  108 (90 Base) MCG/ACT inhaler TAKE 2 PUFFS BY MOUTH EVERY 6 HOURS AS NEEDED FOR WHEEZE OR SHORTNESS OF BREATH 8.5 each 2   meloxicam (MOBIC) 15 MG tablet Take 1 tablet (15 mg total) by mouth daily. 30 tablet 1   omeprazole (PRILOSEC) 20 MG capsule Take 1 capsule (20 mg total) by mouth daily. 90 capsule 0   Probiotic Product (PROBIOTIC DAILY PO) Take 1 tablet by mouth daily.     tamsulosin (FLOMAX) 0.4 MG CAPS capsule Take 1 capsule (0.4 mg total) by mouth daily. 30 capsule 3   vitamin E 400 UNIT capsule Take 400 Units by mouth daily.     No current facility-administered medications on file prior to visit.   No Known Allergies Social History   Socioeconomic History   Marital status: Married    Spouse name: Not on file   Number of children: Not on file   Years of education: Not on file   Highest education level: Not on file  Occupational History   Not on file  Tobacco Use   Smoking status: Never   Smokeless tobacco: Never  Substance and Sexual Activity   Alcohol use: No   Drug use: No   Sexual activity: Yes  Other Topics Concern   Not on file  Social History Narrative   Married with 2 children   Right handed   12 th    3 cups daily   Social Determinants of Health   Financial Resource Strain: Not on file  Food Insecurity: Not on file  Transportation Needs: Not on file  Physical Activity: Not on file  Stress: Not on file  Social Connections: Not on file  Intimate Partner Violence: Not on file      Review of Systems  All other  systems reviewed and are negative.      Objective:   Physical Exam Constitutional:      General: He is not in acute distress.    Appearance: Normal appearance. He is not ill-appearing, toxic-appearing or diaphoretic.  Cardiovascular:     Rate and Rhythm: Normal rate and regular rhythm.     Heart sounds: Normal heart sounds. No murmur heard.    No friction rub. No gallop.  Pulmonary:     Effort: Pulmonary effort is normal. No respiratory distress.     Breath sounds: Normal air entry. No stridor, decreased air movement or transmitted upper airway sounds. No decreased breath sounds, wheezing, rhonchi or rales.  Chest:     Chest wall: No deformity, swelling, tenderness, crepitus or edema. There is no dullness to percussion.  Musculoskeletal:     Right lower leg: No edema.     Left lower leg: No edema.  Lymphadenopathy:     Cervical: No cervical adenopathy.  Neurological:     Mental Status: He is alert.           Assessment & Plan:  Hyperlipidemia, unspecified hyperlipidemia type - Plan: Lipid panel, COMPLETE METABOLIC PANEL WITH GFR, Hemoglobin A1c  Prediabetes - Plan: Lipid panel, COMPLETE METABOLIC PANEL WITH GFR, Hemoglobin A1c  Recheck A1c along with a fasting lipid panel.  If LDL cholesterol is greater than 100 I would recommend a statin.  We also discussed a coronary artery calcium score to determine his absolute risk if he elects not to take a statin.  If hemoglobin A1c is greater than 6.5 I would recommend metformin.  Encouraged low carbohydrate diet.

## 2022-12-10 ENCOUNTER — Telehealth: Payer: Self-pay

## 2022-12-10 ENCOUNTER — Encounter: Payer: Self-pay | Admitting: Family Medicine

## 2022-12-10 LAB — COMPLETE METABOLIC PANEL WITH GFR
AG Ratio: 1.5 (calc) (ref 1.0–2.5)
ALT: 9 U/L (ref 9–46)
AST: 11 U/L (ref 10–35)
Albumin: 4.1 g/dL (ref 3.6–5.1)
Alkaline phosphatase (APISO): 56 U/L (ref 35–144)
BUN: 18 mg/dL (ref 7–25)
CO2: 31 mmol/L (ref 20–32)
Calcium: 9.3 mg/dL (ref 8.6–10.3)
Chloride: 101 mmol/L (ref 98–110)
Creat: 1.14 mg/dL (ref 0.70–1.28)
Globulin: 2.7 g/dL (calc) (ref 1.9–3.7)
Glucose, Bld: 106 mg/dL — ABNORMAL HIGH (ref 65–99)
Potassium: 5 mmol/L (ref 3.5–5.3)
Sodium: 138 mmol/L (ref 135–146)
Total Bilirubin: 0.7 mg/dL (ref 0.2–1.2)
Total Protein: 6.8 g/dL (ref 6.1–8.1)
eGFR: 67 mL/min/{1.73_m2} (ref >=60)

## 2022-12-10 LAB — LIPID PANEL
Cholesterol: 213 mg/dL — ABNORMAL HIGH (ref <200)
HDL: 60 mg/dL (ref >=40)
LDL Cholesterol (Calc): 127 mg/dL (calc) — ABNORMAL HIGH
Non-HDL Cholesterol (Calc): 153 mg/dL (calc) — ABNORMAL HIGH (ref <130)
Total CHOL/HDL Ratio: 3.6 (calc) (ref <5.0)
Triglycerides: 138 mg/dL (ref <150)

## 2022-12-10 LAB — HEMOGLOBIN A1C
Hgb A1c MFr Bld: 6.3 % of total Hgb — ABNORMAL HIGH (ref <5.7)
Mean Plasma Glucose: 134 mg/dL
eAG (mmol/L): 7.4 mmol/L

## 2022-12-10 NOTE — Telephone Encounter (Signed)
Labs show stable prediabetes, monitor every 6 months and reduce carbs in diet.  Cholesterol remain elevated for a prediabetic, I would take crestor 10 mg poqday and recheck in 6 months to reduce risk of heart attacks and strokes.   1/26 @ 9:22 am: LM for pt to call office to advise. Mjp,lpn

## 2022-12-13 ENCOUNTER — Other Ambulatory Visit: Payer: Self-pay | Admitting: Family Medicine

## 2022-12-13 DIAGNOSIS — R7303 Prediabetes: Secondary | ICD-10-CM

## 2022-12-13 DIAGNOSIS — E7800 Pure hypercholesterolemia, unspecified: Secondary | ICD-10-CM

## 2023-01-24 ENCOUNTER — Ambulatory Visit
Admission: RE | Admit: 2023-01-24 | Discharge: 2023-01-24 | Disposition: A | Discharge status: Home / Self Care | Payer: Medicare Other | Source: Ambulatory Visit | Attending: Family Medicine | Admitting: Family Medicine

## 2023-01-24 ENCOUNTER — Ambulatory Visit
Admission: RE | Admit: 2023-01-24 | Discharge: 2023-01-24 | Disposition: A | Discharge status: Home / Self Care | Payer: No Typology Code available for payment source | Source: Ambulatory Visit | Attending: Family Medicine | Admitting: Family Medicine

## 2023-01-24 DIAGNOSIS — E7800 Pure hypercholesterolemia, unspecified: Secondary | ICD-10-CM

## 2023-01-24 DIAGNOSIS — I7121 Aneurysm of the ascending aorta, without rupture: Secondary | ICD-10-CM

## 2023-01-24 DIAGNOSIS — R7303 Prediabetes: Secondary | ICD-10-CM

## 2023-01-24 MED ORDER — IOPAMIDOL (ISOVUE-370) INJECTION 76%
75.0000 mL | Freq: Once | INTRAVENOUS | Status: AC | PRN
  Administered 2023-01-24: 75 mL via INTRAVENOUS

## 2023-04-29 ENCOUNTER — Encounter: Payer: Self-pay | Admitting: Family Medicine

## 2023-04-29 ENCOUNTER — Ambulatory Visit (INDEPENDENT_AMBULATORY_CARE_PROVIDER_SITE_OTHER): Payer: Medicare Other | Admitting: Family Medicine

## 2023-04-29 VITALS — BP 130/76 | HR 61 | Temp 98.3°F | Ht 70.0 in | Wt 217.0 lb

## 2023-04-29 DIAGNOSIS — M7989 Other specified soft tissue disorders: Secondary | ICD-10-CM

## 2023-04-29 NOTE — Progress Notes (Signed)
Subjective:    Patient ID: Howard Davenport, male    DOB: 1946/04/02, 77 y.o.   MRN: [id number]  HPI  Patient presents with asymmetric swelling in his left leg greater than his right leg.  He has trace pretibial edema in the left mid shin to +1 edema in the left ankle.  He has a history of a pulmonary embolism and he is concerned that he may have a blood clot in his leg.  He denies any recent hospitalization or surgery injury.  No pain in his leg.  No pain in ankle.  He denies any recent plane or prolonged car ride. Past Medical History:  Diagnosis Date   Ascending aortic aneurysm ([facility])    4 cm 2019   Colon polyp    tubular adenoma 01/2018   GERD (gastroesophageal reflux disease)    Hiatal hernia    HLD (hyperlipidemia)    Inguinal hernia    Prediabetes    Pulmonary emboli (HCC)    Pulmonary emboli (HCC)    Past Surgical History:  Procedure Laterality Date   HERNIA REPAIR     Current Outpatient Medications on File Prior to Visit  Medication Sig Dispense Refill   albuterol (VENTOLIN HFA) 108 (90 Base) MCG/ACT inhaler TAKE 2 PUFFS BY MOUTH EVERY 6 HOURS AS NEEDED FOR WHEEZE OR SHORTNESS OF BREATH 8.5 each 2   albuterol (VENTOLIN HFA) 108 (90 Base) MCG/ACT inhaler Inhale 2 puffs into the lungs every 6 (six) hours as needed for wheezing or shortness of breath. 8 g 1   meloxicam (MOBIC) 15 MG tablet Take 1 tablet (15 mg total) by mouth daily. 30 tablet 1   omeprazole (PRILOSEC) 20 MG capsule Take 1 capsule (20 mg total) by mouth daily. 90 capsule 0   Probiotic Product (PROBIOTIC DAILY PO) Take 1 tablet by mouth daily.     tamsulosin (FLOMAX) 0.4 MG CAPS capsule Take 1 capsule (0.4 mg total) by mouth daily. 30 capsule 3   vitamin E 400 UNIT capsule Take 400 Units by mouth daily.     No current facility-administered medications on file prior to visit.   No Known Allergies Social History   Socioeconomic History   Marital status: Married    Spouse name: Not on file   Number of  children: Not on file   Years of education: Not on file   Highest education level: Not on file  Occupational History   Not on file  Tobacco Use   Smoking status: Never   Smokeless tobacco: Never  Substance and Sexual Activity   Alcohol use: No   Drug use: No   Sexual activity: Yes  Other Topics Concern   Not on file  Social History Narrative   Married with 2 children   Right handed   12 th    3 cups daily   Social Determinants of Health   Financial Resource Strain: Not on file  Food Insecurity: Not on file  Transportation Needs: Not on file  Physical Activity: Not on file  Stress: Not on file  Social Connections: Not on file  Intimate Partner Violence: Not on file      Review of Systems  All other systems reviewed and are negative.      Objective:   Physical Exam Constitutional:      General: He is not in acute distress.    Appearance: Normal appearance. He is not ill-appearing, toxic-appearing or diaphoretic.  Cardiovascular:     Rate and Rhythm: Normal  rate and regular rhythm.     Heart sounds: Normal heart sounds. No murmur heard.    No friction rub. No gallop.  Pulmonary:     Effort: Pulmonary effort is normal. No respiratory distress.     Breath sounds: Normal air entry. No stridor, decreased air movement or transmitted upper airway sounds. No decreased breath sounds, wheezing, rhonchi or rales.  Chest:     Chest wall: No deformity, swelling, tenderness, crepitus or edema. There is no dullness to percussion.  Musculoskeletal:     Right lower leg: No edema.     Left lower leg: Edema present.  Lymphadenopathy:     Cervical: No cervical adenopathy.  Neurological:     Mental Status: He is alert.           Assessment & Plan:  Left leg swelling - Plan: US Venous Img Lower Unilateral Left  Patient has asymmetric edema in the left leg.  I am unable to rule out blood clot today.  He has negative Homans' sign.  He does not want to go to the hospital to  get an ultrasound.  I will schedule the patient for an ultrasound first available appointment next week.  Meanwhile we have decided together to start Xarelto 15 mg twice daily until he gets the results back from the ultrasound.  Discontinue any NSAID while taking Xarelto

## 2023-05-09 ENCOUNTER — Ambulatory Visit
Admission: RE | Admit: 2023-05-09 | Discharge: 2023-05-09 | Disposition: A | Discharge status: Home / Self Care | Payer: Medicare Other | Source: Ambulatory Visit | Attending: Family Medicine | Admitting: Family Medicine

## 2023-05-09 DIAGNOSIS — M7989 Other specified soft tissue disorders: Secondary | ICD-10-CM

## 2023-05-26 ENCOUNTER — Telehealth: Payer: Self-pay | Admitting: Family Medicine

## 2023-05-26 ENCOUNTER — Other Ambulatory Visit: Payer: Self-pay

## 2023-05-26 ENCOUNTER — Other Ambulatory Visit: Payer: Self-pay | Admitting: Family Medicine

## 2023-05-26 MED ORDER — MELOXICAM 15 MG PO TABS: 15.0000 mg | ORAL_TABLET | Freq: Every day | ORAL | 1 refills | Status: DC

## 2023-05-26 NOTE — Telephone Encounter (Signed)
Prescription Request  05/26/2023  LOV: 04/29/2023  What is the name of the medication or equipment?   meloxicam (MOBIC) 15 MG tablet [[id number]]  **Originally prescribed by DO Madelyn Brunner. Patient stated Dr. Demetrius Charity agreed to fill script as needed**  Have you contacted your pharmacy to request a refill? Yes   Which pharmacy would you like this sent to?  [id number] [place], [place] - [address] [place] [address] Phone: [phone] Fax: [phone]    Patient notified that their request is being sent to the clinical staff for review and that they should receive a response within 2 business days.   Please advise patient when refill sent at [phone].

## 2023-06-06 ENCOUNTER — Other Ambulatory Visit: Payer: Self-pay

## 2023-06-06 NOTE — Telephone Encounter (Signed)
Prescription Request  06/06/2023  LOV: 04/29/23  What is the name of the medication or equipment? omeprazole (PRILOSEC) 20 MG capsule [[id number]]  Have you contacted your pharmacy to request a refill? Yes   Which pharmacy would you like this sent to?  [id number] [place], [place] - [address] [place] [address] Phone: [phone] Fax: [phone]    Patient notified that their request is being sent to the clinical staff for review and that they should receive a response within 2 business days.   Please advise at [facility] [phone]

## 2023-06-07 MED ORDER — OMEPRAZOLE 20 MG PO CPDR: 20.0000 mg | DELAYED_RELEASE_CAPSULE | Freq: Every day | ORAL | 1 refills | Status: DC

## 2023-06-07 NOTE — Telephone Encounter (Signed)
Requested Prescriptions  Pending Prescriptions Disp Refills   omeprazole (PRILOSEC) 20 MG capsule 90 capsule 1    Sig: Take 1 capsule (20 mg total) by mouth daily.     Gastroenterology: Proton Pump Inhibitors Failed - 06/06/2023 10:09 AM      Failed - Valid encounter within last 12 months    Recent Outpatient Visits           2 years ago History of pulmonary embolism   [facility] Tanya Nones, Priscille Heidelberg, MD   2 years ago Dog bite, initial encounter   [facility] Tanya Nones, Priscille Heidelberg, MD   2 years ago Dog bite, initial encounter   [facility] Valentino Nose, NP   2 years ago Mild intermittent asthma with acute exacerbation   [facility] Donita Brooks, MD   2 years ago Pulmonary embolism and infarction [facility])   [facility] Pickard, Priscille Heidelberg, MD

## 2023-07-27 ENCOUNTER — Other Ambulatory Visit: Payer: Self-pay | Admitting: Family Medicine

## 2023-07-28 NOTE — Telephone Encounter (Signed)
Requested Prescriptions  Pending Prescriptions Disp Refills   meloxicam (MOBIC) 15 MG tablet [Pharmacy Med Name: MELOXICAM 15 MG TABLET] 90 tablet 0    Sig: TAKE 1 TABLET (15 MG TOTAL) BY MOUTH DAILY.     Analgesics:  COX2 Inhibitors Failed - 07/27/2023  1:32 AM      Failed - Manual Review: Labs are only required if the patient has taken medication for more than 8 weeks.      Failed - Valid encounter within last 12 months    Recent Outpatient Visits           2 years ago History of pulmonary embolism   [facility] Donita Brooks, MD   2 years ago Dog bite, initial encounter   [facility] Tanya Nones, Priscille Heidelberg, MD   2 years ago Dog bite, initial encounter   [facility] Valentino Nose, NP   2 years ago Mild intermittent asthma with acute exacerbation   [facility] Donita Brooks, MD   2 years ago Pulmonary embolism and infarction [facility])   [facility] Pickard, Priscille Heidelberg, MD              Passed - HGB in normal range and within 360 days    Hemoglobin  Date Value Ref Range Status  09/08/2022 16.6 13.2 - 17.1 g/dL Final         Passed - Cr in normal range and within 360 days    Creat  Date Value Ref Range Status  12/09/2022 1.14 0.70 - 1.28 mg/dL Final         Passed - HCT in normal range and within 360 days    HCT  Date Value Ref Range Status  09/08/2022 49.8 38.5 - 50.0 % Final         Passed - AST in normal range and within 360 days    AST  Date Value Ref Range Status  12/09/2022 11 10 - 35 U/L Final         Passed - ALT in normal range and within 360 days    ALT  Date Value Ref Range Status  12/09/2022 9 9 - 46 U/L Final         Passed - eGFR is 30 or above and within 360 days    GFR, Est African American  Date Value Ref Range Status  04/17/2021 79 > OR = 60 mL/min/1.82m2 Final   GFR, Est Non African American  Date Value Ref Range Status  04/17/2021 68 > OR = 60  mL/min/1.15m2 Final   eGFR  Date Value Ref Range Status  12/09/2022 67 > OR = 60 mL/min/1.60m2 Final         Passed - Patient is not pregnant

## 2023-09-07 ENCOUNTER — Ambulatory Visit: Payer: Medicare Other

## 2023-09-07 DIAGNOSIS — Z23 Encounter for immunization: Secondary | ICD-10-CM | POA: Diagnosis not present

## 2023-09-07 NOTE — Progress Notes (Signed)
Here for flu vaccine. Given in L deltoid as ordered. Pt tolerated well. Mjp,lpn

## 2023-10-10 ENCOUNTER — Ambulatory Visit: Payer: Medicare Other

## 2023-10-10 VITALS — BP 126/82 | HR 67 | Ht 70.0 in | Wt 219.0 lb

## 2023-10-10 DIAGNOSIS — Z0000 Encounter for general adult medical examination without abnormal findings: Secondary | ICD-10-CM

## 2023-10-10 DIAGNOSIS — Z7729 Contact with and (suspected ) exposure to other hazardous substances: Secondary | ICD-10-CM | POA: Insufficient documentation

## 2023-10-10 NOTE — Patient Instructions (Signed)
  Howard Davenport , Thank you for taking time to come for your Medicare Wellness Visit. I appreciate your ongoing commitment to your health goals. Please review the following plan we discussed and let me know if I can assist you in the future.   These are the goals we discussed:  Goals      Exercise 3x per week (30 min per time)        This is a list of the screening recommended for you and due dates:  Health Maintenance  Topic Date Due   Hepatitis C Screening  Never done   COVID-19 Vaccine (1 - 2023-24 season) 11/18/2023*   Zoster (Shingles) Vaccine (1 of 2) 12/08/2023*   DTaP/Tdap/Td vaccine (4 - Td or Tdap) 04/19/2024   Medicare Annual Wellness Visit  10/09/2024   Pneumonia Vaccine  Completed   Flu Shot  Completed   HPV Vaccine  Aged Out   Colon Cancer Screening  Discontinued  *Topic was postponed. The date shown is not the original due date.

## 2023-10-10 NOTE — Progress Notes (Signed)
Subjective:   Howard Davenport is a 77 y.o. male who presents for Medicare Annual/Subsequent preventive examination.  Visit Complete: In person  Patient Medicare AWV questionnaire was completed by the patient on 10/10/2023; I have confirmed that all information answered by patient is correct and no changes since this date.        Objective:    There were no vitals filed for this visit. There is no height or weight on file to calculate BMI.     01/30/2014    9:38 AM  Advanced Directives  Does Patient Have a Medical Advance Directive? Patient does not have advance directive;Patient would not like information  Pre-existing out of facility DNR order (yellow form or pink MOST form) No    Current Medications (verified) Outpatient Encounter Medications as of 10/10/2023  Medication Sig   albuterol (VENTOLIN HFA) 108 (90 Base) MCG/ACT inhaler TAKE 2 PUFFS BY MOUTH EVERY 6 HOURS AS NEEDED FOR WHEEZE OR SHORTNESS OF BREATH   albuterol (VENTOLIN HFA) 108 (90 Base) MCG/ACT inhaler Inhale 2 puffs into the lungs every 6 (six) hours as needed for wheezing or shortness of breath.   meloxicam (MOBIC) 15 MG tablet TAKE 1 TABLET (15 MG TOTAL) BY MOUTH DAILY.   omeprazole (PRILOSEC) 20 MG capsule Take 1 capsule (20 mg total) by mouth daily.   Probiotic Product (PROBIOTIC DAILY PO) Take 1 tablet by mouth daily.   tamsulosin (FLOMAX) 0.4 MG CAPS capsule Take 1 capsule (0.4 mg total) by mouth daily.   vitamin E 400 UNIT capsule Take 400 Units by mouth daily.   No facility-administered encounter medications on file as of 10/10/2023.    Allergies (verified) Patient has no known allergies.   History: Past Medical History:  Diagnosis Date   Ascending aortic aneurysm (HCC)    4 cm 2019   Colon polyp    tubular adenoma 01/2018   GERD (gastroesophageal reflux disease)    Hiatal hernia    HLD (hyperlipidemia)    Inguinal hernia    Prediabetes    Pulmonary emboli (HCC)    Pulmonary emboli (HCC)     Past Surgical History:  Procedure Laterality Date   HERNIA REPAIR     Family History  Problem Relation Age of Onset   Leukemia Mother    Heart disease Father    Social History   Socioeconomic History   Marital status: Married    Spouse name: Not on file   Number of children: Not on file   Years of education: Not on file   Highest education level: Not on file  Occupational History   Not on file  Tobacco Use   Smoking status: Never   Smokeless tobacco: Never  Substance and Sexual Activity   Alcohol use: No   Drug use: No   Sexual activity: Yes  Other Topics Concern   Not on file  Social History Narrative   Married with 2 children   Right handed   12 th    3 cups daily   Social Determinants of Health   Financial Resource Strain: Not on file  Food Insecurity: Not on file  Transportation Needs: Not on file  Physical Activity: Not on file  Stress: Not on file  Social Connections: Not on file    Tobacco Counseling Counseling given: Not Answered   Clinical Intake:                        Activities of Daily  Living     No data to display          Patient Care Team: Donita Brooks, MD as PCP - General (Family Medicine)  Indicate any recent Medical Services you may have received from other than [facility] providers in the past year (date may be approximate).     Assessment:   This is a routine wellness examination for Howard Davenport.  Hearing/Vision screen No results found.   Goals Addressed   None    Depression Screen    12/09/2022    9:28 AM 09/07/2022   10:16 AM 09/02/2020   10:06 AM 12/31/2019    9:11 AM 06/29/2018    2:36 PM 09/20/2017    2:28 PM 04/21/2015    9:29 AM  PHQ 2/9 Scores  PHQ - 2 Score 0 0 0 0 0 0 0    Fall Risk    12/09/2022    9:28 AM 09/07/2022   10:16 AM 09/02/2020   10:06 AM 12/31/2019    9:12 AM 06/29/2018    2:36 PM  Fall Risk   Falls in the past year? 0 0 0 1 No  Number falls in past yr: 0 0 0 0   Injury  with Fall? 0 0 0    Risk for fall due to : No Fall Risks No Fall Risks     Follow up Falls prevention discussed Falls prevention discussed Falls evaluation completed Education provided     MEDICARE RISK AT HOME:    TIMED UP AND GO:  Was the test performed?  Yes  Length of time to ambulate 10 feet: 8 sec Gait steady and fast without use of assistive device    Cognitive Function:        Immunizations Immunization History  Administered Date(s) Administered   Fluad Quad(high Dose 65+) 08/13/2020, 08/19/2021   Fluad Trivalent(High Dose 65+) 09/07/2023   Fluzone Influenza virus vaccine,trivalent (IIV3), split virus 10/29/2008   Hep A, Unspecified 05/07/2003   Hepatitis A 05/07/2003   Hepatitis A, Adult 05/07/2003   Influenza, High Dose Seasonal PF 08/25/2018   Influenza, Seasonal, Injecte, Preservative Fre 08/12/2010, 08/24/2011, 08/30/2012   Influenza,inj,Quad PF,6+ Mos 08/23/2013, 09/03/2014, 09/02/2015, 09/02/2016, 08/09/2017, 09/03/2019   Influenza-Unspecified 12/27/2003, 10/04/2006, 10/11/2007, 09/02/2009, 08/15/2014, 08/29/2014, 08/16/2015, 07/16/2016, 08/15/2017, 07/29/2020, 08/15/2021   Pneumococcal Conjugate-13 04/19/2014   Pneumococcal Polysaccharide-23 04/30/2015   Td 05/07/2003   Td (Adult), 2 Lf Tetanus Toxid, Preservative Free 05/07/2003   Tdap 04/19/2014   Zoster, Live 08/16/2009, 10/16/2009    TDAP status: Up to date  Flu Vaccine status: Up to date  Pneumococcal vaccine status: Up to date  Covid-19 vaccine status: Completed vaccines  Qualifies for Shingles Vaccine? Yes   Zostavax completed Yes   Shingrix Completed?: Yes  Screening Tests Health Maintenance  Topic Date Due   Hepatitis C Screening  Never done   Medicare Annual Wellness (AWV)  09/08/2023   COVID-19 Vaccine (1 - 2023-24 season) 11/18/2023 (Originally 07/17/2023)   Zoster Vaccines- Shingrix (1 of 2) 12/08/2023 (Originally 08/07/1996)   DTaP/Tdap/Td (4 - Td or Tdap) 04/19/2024   Pneumonia  Vaccine 74+ Years old  Completed   INFLUENZA VACCINE  Completed   HPV VACCINES  Aged Out   Colonoscopy  Discontinued    Health Maintenance  Health Maintenance Due  Topic Date Due   Hepatitis C Screening  Never done   Medicare Annual Wellness (AWV)  09/08/2023    Colorectal cancer screening: No longer required.   Lung Cancer Screening: (Low  Dose CT Chest recommended if Age 63-80 years, 20 pack-year currently smoking OR have quit w/in 15years.) does not qualify.   Lung Cancer Screening Referral: n/a  Additional Screening:  Hepatitis C Screening: does qualify; Completed pt declined  Vision Screening: Recommended annual ophthalmology exams for early detection of glaucoma and other disorders of the eye. Is the patient up to date with their annual eye exam?  Yes  Who is the provider or what is the name of the office in which the patient attends annual eye exams? Pt has changed over to a new provider.  If pt is not established with a provider, would they like to be referred to a provider to establish care? No .   Dental Screening: Recommended annual dental exams for proper oral hygiene  Diabetic Foot Exam: PT IS NOT DIABETIC  Community Resource Referral / Chronic Care Management: CRR required this visit?  No   CCM required this visit?  No     Plan:     I have personally reviewed and noted the following in the patient's chart:   Medical and social history Use of alcohol, tobacco or illicit drugs  Current medications and supplements including opioid prescriptions. Patient is not currently taking opioid prescriptions. Functional ability and status Nutritional status Physical activity Advanced directives List of other physicians Hospitalizations, surgeries, and ER visits in previous 12 months Vitals Screenings to include cognitive, depression, and falls Referrals and appointments  In addition, I have reviewed and discussed with patient certain preventive protocols,  quality metrics, and best practice recommendations. A written personalized care plan for preventive services as well as general preventive health recommendations were provided to patient.     Darral Dash, LPN   16/08/9603   After Visit Summary: (In Person-Printed) AVS printed and given to the patient  Nurse Notes: Discussed Tdap and how to obtain.

## 2023-10-15 NOTE — Progress Notes (Signed)
I have collaborated with the care management provider regarding care management and care coordination activities outlined in this encounter and have reviewed this encounter including documentation in the note and care plan. I am certifying that I agree with the content of this note and encounter as supervising physician.  

## 2023-11-18 ENCOUNTER — Ambulatory Visit: Payer: Medicare Other | Admitting: Family Medicine

## 2023-11-18 ENCOUNTER — Other Ambulatory Visit: Payer: Medicare Other

## 2023-11-18 DIAGNOSIS — M7989 Other specified soft tissue disorders: Secondary | ICD-10-CM

## 2023-11-18 DIAGNOSIS — R7303 Prediabetes: Secondary | ICD-10-CM

## 2023-11-18 DIAGNOSIS — Z125 Encounter for screening for malignant neoplasm of prostate: Secondary | ICD-10-CM | POA: Diagnosis not present

## 2023-11-18 DIAGNOSIS — E785 Hyperlipidemia, unspecified: Secondary | ICD-10-CM

## 2023-11-18 DIAGNOSIS — E7800 Pure hypercholesterolemia, unspecified: Secondary | ICD-10-CM | POA: Diagnosis not present

## 2023-11-19 LAB — COMPLETE METABOLIC PANEL WITH GFR
AG Ratio: 1.6 (calc) (ref 1.0–2.5)
ALT: 12 U/L (ref 9–46)
AST: 14 U/L (ref 10–35)
Albumin: 4.3 g/dL (ref 3.6–5.1)
Alkaline phosphatase (APISO): 62 U/L (ref 35–144)
BUN: 17 mg/dL (ref 7–25)
CO2: 28 mmol/L (ref 20–32)
Calcium: 9.5 mg/dL (ref 8.6–10.3)
Chloride: 102 mmol/L (ref 98–110)
Creat: 1.16 mg/dL (ref 0.70–1.28)
Globulin: 2.7 g/dL (ref 1.9–3.7)
Glucose, Bld: 113 mg/dL — ABNORMAL HIGH (ref 65–99)
Potassium: 5.4 mmol/L — ABNORMAL HIGH (ref 3.5–5.3)
Sodium: 139 mmol/L (ref 135–146)
Total Bilirubin: 0.8 mg/dL (ref 0.2–1.2)
Total Protein: 7 g/dL (ref 6.1–8.1)
eGFR: 65 mL/min/{1.73_m2} (ref >=60)

## 2023-11-19 LAB — LIPID PANEL
Cholesterol: 205 mg/dL — ABNORMAL HIGH (ref <200)
HDL: 56 mg/dL (ref >=40)
LDL Cholesterol (Calc): 124 mg/dL — ABNORMAL HIGH
Non-HDL Cholesterol (Calc): 149 mg/dL — ABNORMAL HIGH (ref <130)
Total CHOL/HDL Ratio: 3.7 (calc) (ref <5.0)
Triglycerides: 137 mg/dL (ref <150)

## 2023-11-19 LAB — CBC WITH DIFFERENTIAL/PLATELET
Absolute Lymphocytes: 2328 {cells}/uL (ref 850–3900)
Absolute Monocytes: 738 {cells}/uL (ref 200–950)
Basophils Absolute: 48 {cells}/uL (ref 0–200)
Basophils Relative: 0.8 %
Eosinophils Absolute: 252 {cells}/uL (ref 15–500)
Eosinophils Relative: 4.2 %
HCT: 46.8 % (ref 38.5–50.0)
Hemoglobin: 15.2 g/dL (ref 13.2–17.1)
MCH: 29.9 pg (ref 27.0–33.0)
MCHC: 32.5 g/dL (ref 32.0–36.0)
MCV: 92.1 fL (ref 80.0–100.0)
MPV: 10.1 fL (ref 7.5–12.5)
Monocytes Relative: 12.3 %
Neutro Abs: 2634 {cells}/uL (ref 1500–7800)
Neutrophils Relative %: 43.9 %
Platelets: 166 10*3/uL (ref 140–400)
RBC: 5.08 10*6/uL (ref 4.20–5.80)
RDW: 13 % (ref 11.0–15.0)
Total Lymphocyte: 38.8 %
WBC: 6 10*3/uL (ref 3.8–10.8)

## 2023-11-19 LAB — HEMOGLOBIN A1C
Hgb A1c MFr Bld: 6.4 %{Hb} — ABNORMAL HIGH (ref <5.7)
Mean Plasma Glucose: 137 mg/dL
eAG (mmol/L): 7.6 mmol/L

## 2023-11-19 LAB — PSA: PSA: 2.98 ng/mL (ref <=4.00)

## 2023-11-22 ENCOUNTER — Encounter: Payer: Self-pay | Admitting: Family Medicine

## 2023-11-22 ENCOUNTER — Ambulatory Visit (INDEPENDENT_AMBULATORY_CARE_PROVIDER_SITE_OTHER): Payer: Medicare HMO | Admitting: Family Medicine

## 2023-11-22 VITALS — BP 124/72 | HR 76 | Temp 97.8°F | Ht 70.0 in | Wt 220.4 lb

## 2023-11-22 DIAGNOSIS — Z0000 Encounter for general adult medical examination without abnormal findings: Secondary | ICD-10-CM

## 2023-11-22 DIAGNOSIS — R7303 Prediabetes: Secondary | ICD-10-CM | POA: Diagnosis not present

## 2023-11-22 DIAGNOSIS — Z0001 Encounter for general adult medical examination with abnormal findings: Secondary | ICD-10-CM | POA: Diagnosis not present

## 2023-11-22 DIAGNOSIS — E7800 Pure hypercholesterolemia, unspecified: Secondary | ICD-10-CM | POA: Diagnosis not present

## 2023-11-22 MED ORDER — OMEPRAZOLE 20 MG PO CPDR: 20.0000 mg | DELAYED_RELEASE_CAPSULE | Freq: Every day | ORAL | 3 refills | Status: DC

## 2023-11-22 MED ORDER — ROSUVASTATIN CALCIUM 10 MG PO TABS: 10.0000 mg | ORAL_TABLET | Freq: Every day | ORAL | 3 refills | Status: DC

## 2023-11-22 NOTE — Progress Notes (Signed)
 Subjective:    Patient ID: Howard Davenport, male    DOB: 08/11/1946, 78 y.o.   MRN: [id number]  HPI  Patient is a very pleasant 78 year old Caucasian gentleman here today for complete physical exam.  His last colonoscopy was in 2019.  I recommended another colonoscopy in 3 to 5 years.  He is overdue.  He prefers to call his gastroenterologist and schedule this himself.  His PSA was stable.  He is due for shingles vaccine, the new pneumonia vaccine, flu shot, and a tetanus shot.  We also discussed RSV.  The patient defers all vaccinations at the present time.  He has borderline diabetes.  He also has hyperlipidemia.  He has coronary atherosclerosis seen on a CAT scan last year.  His coronary artery calcium  score was 78 percentile.  I have strongly recommended a statin for these reason.  He has declined that at this point.  His most recent lab work is listed below. Lab on 11/18/2023  Component Date Value Ref Range Status   WBC 11/18/2023 6.0  3.8 - 10.8 Thousand/uL Final   RBC 11/18/2023 5.08  4.20 - 5.80 Million/uL Final   Hemoglobin 11/18/2023 15.2  13.2 - 17.1 g/dL Final   HCT 98/96/7974 46.8  38.5 - 50.0 % Final   MCV 11/18/2023 92.1  80.0 - 100.0 fL Final   MCH 11/18/2023 29.9  27.0 - 33.0 pg Final   MCHC 11/18/2023 32.5  32.0 - 36.0 g/dL Final   Comment: For adults, a slight decrease in the calculated MCHC value (in the range of 30 to 32 g/dL) is most likely not clinically significant; however, it should be interpreted with caution in correlation with other red cell parameters and the patient's clinical condition.    RDW 11/18/2023 13.0  11.0 - 15.0 % Final   Platelets 11/18/2023 166  140 - 400 Thousand/uL Final   MPV 11/18/2023 10.1  7.5 - 12.5 fL Final   Neutro Abs 11/18/2023 2,634  1,500 - 7,800 cells/uL Final   Absolute Lymphocytes 11/18/2023 2,328  850 - 3,900 cells/uL Final   Absolute Monocytes 11/18/2023 738  200 - 950 cells/uL Final   Eosinophils Absolute 11/18/2023 252  15 -  500 cells/uL Final   Basophils Absolute 11/18/2023 48  0 - 200 cells/uL Final   Neutrophils Relative % 11/18/2023 43.9  % Final   Total Lymphocyte 11/18/2023 38.8  % Final   Monocytes Relative 11/18/2023 12.3  % Final   Eosinophils Relative 11/18/2023 4.2  % Final   Basophils Relative 11/18/2023 0.8  % Final   Glucose, Bld 11/18/2023 113 (H)  65 - 99 mg/dL Final   Comment: .            Fasting reference interval . For someone without known diabetes, a glucose value between 100 and 125 mg/dL is consistent with prediabetes and should be confirmed with a follow-up test. .    BUN 11/18/2023 17  7 - 25 mg/dL Final   Creat 98/96/7974 1.16  0.70 - 1.28 mg/dL Final   eGFR 98/96/7974 65  > OR = 60 mL/min/1.25m2 Final   BUN/Creatinine Ratio 11/18/2023 SEE NOTE:  6 - 22 (calc) Final   Comment:    Not Reported: BUN and Creatinine are within    reference range. .    Sodium 11/18/2023 139  135 - 146 mmol/L Final   Potassium 11/18/2023 5.4 (H)  3.5 - 5.3 mmol/L Final   Chloride 11/18/2023 102  98 - 110 mmol/L Final  CO2 11/18/2023 28  20 - 32 mmol/L Final   Calcium  11/18/2023 9.5  8.6 - 10.3 mg/dL Final   Total Protein 98/96/7974 7.0  6.1 - 8.1 g/dL Final   Albumin 98/96/7974 4.3  3.6 - 5.1 g/dL Final   Globulin 98/96/7974 2.7  1.9 - 3.7 g/dL (calc) Final   AG Ratio 11/18/2023 1.6  1.0 - 2.5 (calc) Final   Total Bilirubin 11/18/2023 0.8  0.2 - 1.2 mg/dL Final   Alkaline phosphatase (APISO) 11/18/2023 62  35 - 144 U/L Final   AST 11/18/2023 14  10 - 35 U/L Final   ALT 11/18/2023 12  9 - 46 U/L Final   Hgb A1c MFr Bld 11/18/2023 6.4 (H)  <5.7 % of total Hgb Final   Comment: For someone without known diabetes, a hemoglobin  A1c value between 5.7% and 6.4% is consistent with prediabetes and should be confirmed with a  follow-up test. . For someone with known diabetes, a value <7% indicates that their diabetes is well controlled. A1c targets should be individualized based on duration  of diabetes, age, comorbid conditions, and other considerations. . This assay result is consistent with an increased risk of diabetes. . Currently, no consensus exists regarding use of hemoglobin A1c for diagnosis of diabetes for children. .    Mean Plasma Glucose 11/18/2023 137  mg/dL Final   eAG (mmol/L) 98/96/7974 7.6  mmol/L Final   Cholesterol 11/18/2023 205 (H)  <200 mg/dL Final   HDL 98/96/7974 56  > OR = 40 mg/dL Final   Triglycerides 98/96/7974 137  <150 mg/dL Final   LDL Cholesterol (Calc) 11/18/2023 124 (H)  mg/dL (calc) Final   Comment: Reference range: <100 . Desirable range <100 mg/dL for primary prevention;   <70 mg/dL for patients with CHD or diabetic patients  with > or = 2 CHD risk factors. SABRA LDL-C is now calculated using the Martin-Hopkins  calculation, which is a validated novel method providing  better accuracy than the Friedewald equation in the  estimation of LDL-C.  Gladis APPLETHWAITE et al. SANDREA. 7986;689(80): 2061-2068  (http://education.[facility].com/faq/FAQ164)    Total CHOL/HDL Ratio 11/18/2023 3.7  <4.9 (calc) Final   Non-HDL Cholesterol (Calc) 11/18/2023 149 (H)  <130 mg/dL (calc) Final   Comment: For patients with diabetes plus 1 major ASCVD risk  factor, treating to a non-HDL-C goal of <100 mg/dL  (LDL-C of <29 mg/dL) is considered a therapeutic  option.    PSA 11/18/2023 2.98  < OR = 4.00 ng/mL Final   Comment: The total PSA value from this assay system is  standardized against the WHO standard. The test  result will be approximately 20% lower when compared  to the equimolar-standardized total PSA (Beckman  Coulter). Comparison of serial PSA results should be  interpreted with this fact in mind. . This test was performed using the Siemens  chemiluminescent method. Values obtained from  different assay methods cannot be used interchangeably. PSA levels, regardless of value, should not be interpreted as absolute evidence of the presence  or absence of disease.     Past Medical History:  Diagnosis Date   Ascending aortic aneurysm (HCC)    4 cm 2019   Colon polyp    tubular adenoma 01/2018   GERD (gastroesophageal reflux disease)    Hiatal hernia    HLD (hyperlipidemia)    Inguinal hernia    Prediabetes    Pulmonary emboli (HCC)    Pulmonary emboli (HCC)    Past Surgical History:  Procedure Laterality Date   HERNIA REPAIR     Current Outpatient Medications on File Prior to Visit  Medication Sig Dispense Refill   albuterol  (VENTOLIN  HFA) 108 (90 Base) MCG/ACT inhaler TAKE 2 PUFFS BY MOUTH EVERY 6 HOURS AS NEEDED FOR WHEEZE OR SHORTNESS OF BREATH 8.5 each 2   albuterol  (VENTOLIN  HFA) 108 (90 Base) MCG/ACT inhaler Inhale 2 puffs into the lungs every 6 (six) hours as needed for wheezing or shortness of breath. 8 g 1   meloxicam  (MOBIC ) 15 MG tablet TAKE 1 TABLET (15 MG TOTAL) BY MOUTH DAILY. 90 tablet 0   Probiotic Product (PROBIOTIC DAILY PO) Take 1 tablet by mouth daily.     tamsulosin  (FLOMAX ) 0.4 MG CAPS capsule Take 1 capsule (0.4 mg total) by mouth daily. 30 capsule 3   vitamin E 400 UNIT capsule Take 400 Units by mouth daily.     No current facility-administered medications on file prior to visit.   No Known Allergies Social History   Socioeconomic History   Marital status: Married    Spouse name: Barnie   Number of children: Not on file   Years of education: Not on file   Highest education level: Not on file  Occupational History   Not on file  Tobacco Use   Smoking status: Never   Smokeless tobacco: Never  Substance and Sexual Activity   Alcohol use: No   Drug use: No   Sexual activity: Yes  Other Topics Concern   Not on file  Social History Narrative   Married with 2 children   Right handed   12 th    3 cups daily   Social Drivers of Health   Financial Resource Strain: Low Risk  (10/10/2023)   Overall Financial Resource Strain (CARDIA)    Difficulty of Paying Living Expenses: Not  hard at all  Food Insecurity: No Food Insecurity (10/10/2023)   Hunger Vital Sign    Worried About Running Out of Food in the Last Year: Never true    Ran Out of Food in the Last Year: Never true  Transportation Needs: No Transportation Needs (10/10/2023)   PRAPARE - Administrator, Civil Service (Medical): No    Lack of Transportation (Non-Medical): No  Physical Activity: Insufficiently Active (10/10/2023)   Exercise Vital Sign    Days of Exercise per Week: 3 days    Minutes of Exercise per Session: 30 min  Stress: No Stress Concern Present (10/10/2023)   [employer] of Occupational Health - Occupational Stress Questionnaire    Feeling of Stress : Not at all  Social Connections: Socially Integrated (10/10/2023)   Social Connection and Isolation Panel [NHANES]    Frequency of Communication with Friends and Family: More than three times a week    Frequency of Social Gatherings with Friends and Family: More than three times a week    Attends Religious Services: More than 4 times per year    Active Member of [employer] or Organizations: Yes    Attends Banker Meetings: More than 4 times per year    Marital Status: Married  Catering Manager Violence: Not At Risk (10/10/2023)   Humiliation, Afraid, Rape, and Kick questionnaire    Fear of Current or Ex-Partner: No    Emotionally Abused: No    Physically Abused: No    Sexually Abused: No      Review of Systems  All other systems reviewed and are negative.      Objective:  Physical Exam Constitutional:      General: He is not in acute distress.    Appearance: Normal appearance. He is not ill-appearing, toxic-appearing or diaphoretic.  HENT:     Head: Normocephalic and atraumatic.     Right Ear: Tympanic membrane and ear canal normal. There is no impacted cerumen.     Left Ear: Tympanic membrane and ear canal normal. There is no impacted cerumen.     Nose: Nose normal. No congestion or rhinorrhea.      Mouth/Throat:     Mouth: Mucous membranes are moist.     Pharynx: Oropharynx is clear. No oropharyngeal exudate or posterior oropharyngeal erythema.  Eyes:     Extraocular Movements: Extraocular movements intact.     Conjunctiva/sclera: Conjunctivae normal.     Pupils: Pupils are equal, round, and reactive to light.  Neck:     Vascular: No carotid bruit.  Cardiovascular:     Rate and Rhythm: Normal rate and regular rhythm.     Pulses: Normal pulses.     Heart sounds: Normal heart sounds. No murmur heard.    No friction rub. No gallop.  Pulmonary:     Effort: Pulmonary effort is normal. No respiratory distress.     Breath sounds: Normal breath sounds and air entry. No stridor, decreased air movement or transmitted upper airway sounds. No decreased breath sounds, wheezing, rhonchi or rales.  Chest:     Chest wall: No deformity, swelling, tenderness, crepitus or edema. There is no dullness to percussion.  Abdominal:     General: Abdomen is flat. Bowel sounds are normal. There is no distension.     Palpations: Abdomen is soft.     Tenderness: There is no abdominal tenderness. There is no guarding or rebound.     Hernia: A hernia is present.  Genitourinary:    Penis: Normal.      Testes: Normal.  Musculoskeletal:        General: No swelling, tenderness, deformity or signs of injury.     Cervical back: Normal range of motion and neck supple. No rigidity.     Right lower leg: No edema.     Left lower leg: No edema.  Lymphadenopathy:     Cervical: No cervical adenopathy.  Skin:    Coloration: Skin is not jaundiced or pale.     Findings: No bruising, erythema, lesion or rash.  Neurological:     General: No focal deficit present.     Mental Status: He is alert and oriented to person, place, and time. Mental status is at baseline.     Cranial Nerves: No cranial nerve deficit.     Motor: No weakness.     Gait: Gait normal.     Deep Tendon Reflexes: Reflexes normal.  Psychiatric:         Mood and Affect: Mood normal.        Behavior: Behavior normal.        Thought Content: Thought content normal.        Judgment: Judgment normal.           Assessment & Plan:  General medical exam  Prediabetes  Pure hypercholesterolemia Strongly encouraged the patient to take a statin.  He agrees to try Crestor  10 mg a day.  He will contact his gastroenterologist to schedule colonoscopy.  Recommended a flu shot.  Recommended a shingles vaccine.  Recommended the pneumonia vaccine.  Recommended a tetanus shot.  He declines these today.  PSA is stable.  Blood pressure is acceptable.

## 2024-02-03 ENCOUNTER — Ambulatory Visit (INDEPENDENT_AMBULATORY_CARE_PROVIDER_SITE_OTHER): Admitting: Family Medicine

## 2024-02-03 ENCOUNTER — Encounter: Payer: Self-pay | Admitting: Family Medicine

## 2024-02-03 ENCOUNTER — Ambulatory Visit: Payer: Self-pay

## 2024-02-03 VITALS — BP 122/72 | HR 55 | Temp 97.6°F | Ht 70.0 in | Wt 222.4 lb

## 2024-02-03 DIAGNOSIS — L959 Vasculitis limited to the skin, unspecified: Secondary | ICD-10-CM

## 2024-02-03 MED ORDER — PREDNISONE 20 MG PO TABS: ORAL_TABLET | ORAL | 0 refills | Status: AC

## 2024-02-03 NOTE — Progress Notes (Signed)
 Subjective:    Patient ID: Howard Davenport, male    DOB: 09-27-1946, 78 y.o.   MRN: [id number]  Rash  Patient has noticed 1 to 2 mm red macules on his lower legs distal to his knee down to his ankle.  These are petechiae.  They just appeared overnight.  There are numerous petechiae on his legs.  They were not present before.  There is no swelling in his legs.  He denies any joint pain.  He denies any fevers or chills.  He denies any muscle pains.  He denies any headaches or cough or shortness of breath or nausea or vomiting.  However today during his exam, I had the patient remove his shirt.  He has a lacy pink reticular rash on his chest and back appear to be a viral exanthem as well this just appeared this morning. Past Medical History:  Diagnosis Date   Ascending aortic aneurysm (HCC)    4 cm 2019   Colon polyp    tubular adenoma 01/2018   GERD (gastroesophageal reflux disease)    Hiatal hernia    HLD (hyperlipidemia)    Inguinal hernia    Prediabetes    Pulmonary emboli (HCC)    Pulmonary emboli (HCC)    Past Surgical History:  Procedure Laterality Date   HERNIA REPAIR     Current Outpatient Medications on File Prior to Visit  Medication Sig Dispense Refill   albuterol (VENTOLIN HFA) 108 (90 Base) MCG/ACT inhaler TAKE 2 PUFFS BY MOUTH EVERY 6 HOURS AS NEEDED FOR WHEEZE OR SHORTNESS OF BREATH 8.5 each 2   albuterol (VENTOLIN HFA) 108 (90 Base) MCG/ACT inhaler Inhale 2 puffs into the lungs every 6 (six) hours as needed for wheezing or shortness of breath. 8 g 1   meloxicam (MOBIC) 15 MG tablet TAKE 1 TABLET (15 MG TOTAL) BY MOUTH DAILY. 90 tablet 0   omeprazole (PRILOSEC) 20 MG capsule Take 1 capsule (20 mg total) by mouth daily. 90 capsule 3   Probiotic Product (PROBIOTIC DAILY PO) Take 1 tablet by mouth daily.     rosuvastatin (CRESTOR) 10 MG tablet Take 1 tablet (10 mg total) by mouth daily. 90 tablet 3   tamsulosin (FLOMAX) 0.4 MG CAPS capsule Take 1 capsule (0.4 mg total) by  mouth daily. 30 capsule 3   vitamin E 400 UNIT capsule Take 400 Units by mouth daily.     No current facility-administered medications on file prior to visit.   No Known Allergies Social History   Socioeconomic History   Marital status: Married    Spouse name: Gavin Pound   Number of children: Not on file   Years of education: Not on file   Highest education level: Not on file  Occupational History   Not on file  Tobacco Use   Smoking status: Never   Smokeless tobacco: Never  Substance and Sexual Activity   Alcohol use: No   Drug use: No   Sexual activity: Yes  Other Topics Concern   Not on file  Social History Narrative   Married with 2 children   Right handed   12 th    3 cups daily   Social Drivers of Health   Financial Resource Strain: Low Risk  (10/10/2023)   Overall Financial Resource Strain (CARDIA)    Difficulty of Paying Living Expenses: Not hard at all  Food Insecurity: No Food Insecurity (10/10/2023)   Hunger Vital Sign    Worried About Running Out of Food  in the Last Year: Never true    Ran Out of Food in the Last Year: Never true  Transportation Needs: No Transportation Needs (10/10/2023)   PRAPARE - Administrator, Civil Service (Medical): No    Lack of Transportation (Non-Medical): No  Physical Activity: Insufficiently Active (10/10/2023)   Exercise Vital Sign    Days of Exercise per Week: 3 days    Minutes of Exercise per Session: 30 min  Stress: No Stress Concern Present (10/10/2023)   [employer] of Occupational Health - Occupational Stress Questionnaire    Feeling of Stress : Not at all  Social Connections: Socially Integrated (10/10/2023)   Social Connection and Isolation Panel [NHANES]    Frequency of Communication with Friends and Family: More than three times a week    Frequency of Social Gatherings with Friends and Family: More than three times a week    Attends Religious Services: More than 4 times per year    Active Member  of [employer] or Organizations: Yes    Attends Banker Meetings: More than 4 times per year    Marital Status: Married  Catering manager Violence: Not At Risk (10/10/2023)   Humiliation, Afraid, Rape, and Kick questionnaire    Fear of Current or Ex-Partner: No    Emotionally Abused: No    Physically Abused: No    Sexually Abused: No      Review of Systems  Skin:  Positive for rash.  All other systems reviewed and are negative.      Objective:   Physical Exam Constitutional:      General: He is not in acute distress.    Appearance: Normal appearance. He is not ill-appearing, toxic-appearing or diaphoretic.  Cardiovascular:     Rate and Rhythm: Normal rate and regular rhythm.     Heart sounds: Normal heart sounds. No murmur heard.    No friction rub. No gallop.  Pulmonary:     Effort: Pulmonary effort is normal. No respiratory distress.     Breath sounds: Normal air entry. No stridor, decreased air movement or transmitted upper airway sounds. No decreased breath sounds, wheezing, rhonchi or rales.  Chest:     Chest wall: No deformity, swelling, tenderness, crepitus or edema. There is no dullness to percussion.  Musculoskeletal:     Right lower leg: No edema.     Left lower leg: No edema.  Lymphadenopathy:     Cervical: No cervical adenopathy.  Skin:    Findings: Rash present.       Neurological:     Mental Status: He is alert.           Assessment & Plan:  Cutaneous vasculitis - Plan: CBC with Differential/Platelet, COMPLETE METABOLIC PANEL WITH GFR, ANA, Sedimentation rate, ANCA Screen Reflex Titer, predniSONE (DELTASONE) 20 MG tablet  Patient denies any signs of systemic illness.  I suspect based on the rash on his chest and back that he has developed a viral exanthem.  This may be triggering an immune system response that is causing cutaneous vasculitis limited to his skin and lower legs.  Start by checking a CBC, sed rate, and ANA, and ANCA, and a CMP.   If lab work suggest a systemic reaction, we will need further evaluation to confirm and to evaluate for potential causes including hep C, cold agglutinins, etc.  If labs showed no evidence of systemic involvement, treat the cutaneous vasculitis with prednisone taper pack.

## 2024-02-03 NOTE — Telephone Encounter (Signed)
 Copied from CRM [id number]. Topic: Clinical - Red Word Triage >> Feb 03, 2024  8:26 AM Howard Davenport wrote: Red Word that prompted transfer to Nurse Triage: pt has a rash/red dots on both legs.  Do not itch.  He just noticed this am. Looks like measles.    Chief Complaint: Ras to both legs, no pain or itching. Small red dots. Symptoms: Above Frequency: Today Pertinent Negatives: Patient denies fever Disposition: [] ED /[] Urgent Care (no appt availability in office) / [x] Appointment(In office/virtual)/ []  [facility]/ [] Home Care/ [] Refused Recommended Disposition /[] [facility]/ []  Follow-up with PCP Additional Notes: Agrees with appointment.  Reason for Disposition  Localized rash present > 7 days  Answer Assessment - Initial Assessment Questions 1. APPEARANCE of RASH: "Describe the rash."      Red dots 2. LOCATION: "Where is the rash located?"      Both legs 3. NUMBER: "How many spots are there?"      Many 4. SIZE: "How big are the spots?" (Inches, centimeters or compare to size of a coin)      Small 5. ONSET: "When did the rash start?"      Today 6. ITCHING: "Does the rash itch?" If Yes, ask: "How bad is the itch?"  (Scale 0-10; or none, mild, moderate, severe)     No 7. PAIN: "Does the rash hurt?" If Yes, ask: "How bad is the pain?"  (Scale 0-10; or none, mild, moderate, severe)    - NONE (0): no pain    - MILD (1-3): doesn't interfere with normal activities     - MODERATE (4-7): interferes with normal activities or awakens from sleep     - SEVERE (8-10): excruciating pain, unable to do any normal activities     None 8. OTHER SYMPTOMS: "Do you have any other symptoms?" (e.g., fever)     No 9. PREGNANCY: "Is there any chance you are pregnant?" "When was your last menstrual period?"     N/a  Protocols used: Rash or Redness - Localized-A-AH

## 2024-02-08 LAB — CBC WITH DIFFERENTIAL/PLATELET
Absolute Lymphocytes: 1436 {cells}/uL (ref 850–3900)
Absolute Monocytes: 578 {cells}/uL (ref 200–950)
Basophils Absolute: 49 {cells}/uL (ref 0–200)
Basophils Relative: 0.9 %
Eosinophils Absolute: 200 {cells}/uL (ref 15–500)
Eosinophils Relative: 3.7 %
HCT: 43.9 % (ref 38.5–50.0)
Hemoglobin: 15.1 g/dL (ref 13.2–17.1)
MCH: 31.2 pg (ref 27.0–33.0)
MCHC: 34.4 g/dL (ref 32.0–36.0)
MCV: 90.7 fL (ref 80.0–100.0)
MPV: 10.3 fL (ref 7.5–12.5)
Monocytes Relative: 10.7 %
Neutro Abs: 3137 {cells}/uL (ref 1500–7800)
Neutrophils Relative %: 58.1 %
Platelets: 161 10*3/uL (ref 140–400)
RBC: 4.84 10*6/uL (ref 4.20–5.80)
RDW: 12.7 % (ref 11.0–15.0)
Total Lymphocyte: 26.6 %
WBC: 5.4 10*3/uL (ref 3.8–10.8)

## 2024-02-08 LAB — COMPLETE METABOLIC PANEL WITH GFR
AG Ratio: 1.6 (calc) (ref 1.0–2.5)
ALT: 11 U/L (ref 9–46)
AST: 12 U/L (ref 10–35)
Albumin: 4.2 g/dL (ref 3.6–5.1)
Alkaline phosphatase (APISO): 56 U/L (ref 35–144)
BUN: 18 mg/dL (ref 7–25)
CO2: 29 mmol/L (ref 20–32)
Calcium: 9.3 mg/dL (ref 8.6–10.3)
Chloride: 102 mmol/L (ref 98–110)
Creat: 1.06 mg/dL (ref 0.70–1.28)
Globulin: 2.7 g/dL (ref 1.9–3.7)
Glucose, Bld: 107 mg/dL — ABNORMAL HIGH (ref 65–99)
Potassium: 4.7 mmol/L (ref 3.5–5.3)
Sodium: 139 mmol/L (ref 135–146)
Total Bilirubin: 0.5 mg/dL (ref 0.2–1.2)
Total Protein: 6.9 g/dL (ref 6.1–8.1)

## 2024-02-08 LAB — ANA: Anti Nuclear Antibody (ANA): NEGATIVE

## 2024-02-08 LAB — SEDIMENTATION RATE: Sed Rate: 2 mm/h (ref 0–20)

## 2024-02-08 LAB — ANCA SCREEN W REFLEX TITER: ANCA SCREEN: NEGATIVE

## 2024-02-10 ENCOUNTER — Ambulatory Visit: Admitting: Family Medicine

## 2024-02-10 ENCOUNTER — Encounter: Payer: Self-pay | Admitting: Family Medicine

## 2024-02-10 VITALS — BP 124/68 | HR 76 | Temp 98.6°F | Ht 70.0 in | Wt 218.0 lb

## 2024-02-10 DIAGNOSIS — K529 Noninfective gastroenteritis and colitis, unspecified: Secondary | ICD-10-CM | POA: Diagnosis not present

## 2024-02-10 NOTE — Progress Notes (Signed)
 Subjective:    Patient ID: Howard Davenport, male    DOB: 28-Jul-1946, 78 y.o.   MRN: [id number]  Rash  02/03/24 Patient has noticed 1 to 2 mm red macules on his lower legs distal to his knee down to his ankle.  These are petechiae.  They just appeared overnight.  There are numerous petechiae on his legs.  They were not present before.  There is no swelling in his legs.  He denies any joint pain.  He denies any fevers or chills.  He denies any muscle pains.  He denies any headaches or cough or shortness of breath or nausea or vomiting.  However today during his exam, I had the patient remove his shirt.  He has a lacy pink reticular rash on his chest and back appear to be a viral exanthem as well this just appeared this morning.  At that time, my plan was:  Patient denies any signs of systemic illness.  I suspect based on the rash on his chest and back that he has developed a viral exanthem.  This may be triggering an immune system response that is causing cutaneous vasculitis limited to his skin and lower legs.  Start by checking a CBC, sed rate, and ANA, and ANCA, and a CMP.  If lab work suggest a systemic reaction, we will need further evaluation to confirm and to evaluate for potential causes including hep C, cold agglutinins, etc.  If labs showed no evidence of systemic involvement, treat the cutaneous vasculitis with prednisone taper pack.  02/10/24 Office Visit on 02/03/2024  Component Date Value Ref Range Status   WBC 02/03/2024 5.4  3.8 - 10.8 Thousand/uL Final   RBC 02/03/2024 4.84  4.20 - 5.80 Million/uL Final   Hemoglobin 02/03/2024 15.1  13.2 - 17.1 g/dL Final   HCT 40/98/1191 43.9  38.5 - 50.0 % Final   MCV 02/03/2024 90.7  80.0 - 100.0 fL Final   MCH 02/03/2024 31.2  27.0 - 33.0 pg Final   MCHC 02/03/2024 34.4  32.0 - 36.0 g/dL Final   Comment: For adults, a slight decrease in the calculated MCHC value (in the range of 30 to 32 g/dL) is most likely not clinically significant;  however, it should be interpreted with caution in correlation with other red cell parameters and the patient's clinical condition.    RDW 02/03/2024 12.7  11.0 - 15.0 % Final   Platelets 02/03/2024 161  140 - 400 Thousand/uL Final   MPV 02/03/2024 10.3  7.5 - 12.5 fL Final   Neutro Abs 02/03/2024 3,137  1,500 - 7,800 cells/uL Final   Absolute Lymphocytes 02/03/2024 1,436  850 - 3,900 cells/uL Final   Absolute Monocytes 02/03/2024 578  200 - 950 cells/uL Final   Eosinophils Absolute 02/03/2024 200  15 - 500 cells/uL Final   Basophils Absolute 02/03/2024 49  0 - 200 cells/uL Final   Neutrophils Relative % 02/03/2024 58.1  % Final   Total Lymphocyte 02/03/2024 26.6  % Final   Monocytes Relative 02/03/2024 10.7  % Final   Eosinophils Relative 02/03/2024 3.7  % Final   Basophils Relative 02/03/2024 0.9  % Final   Glucose, Bld 02/03/2024 107 (H)  65 - 99 mg/dL Final   Comment: .            Fasting reference interval . For someone without known diabetes, a glucose value between 100 and 125 mg/dL is consistent with prediabetes and should be confirmed with a follow-up test. .  BUN 02/03/2024 18  7 - 25 mg/dL Final   Creat 16/08/9603 1.06  0.70 - 1.28 mg/dL Final   BUN/Creatinine Ratio 02/03/2024 SEE NOTE:  6 - 22 (calc) Final   Comment:    Not Reported: BUN and Creatinine are within    reference range. .    Sodium 02/03/2024 139  135 - 146 mmol/L Final   Potassium 02/03/2024 4.7  3.5 - 5.3 mmol/L Final   Chloride 02/03/2024 102  98 - 110 mmol/L Final   CO2 02/03/2024 29  20 - 32 mmol/L Final   Calcium 02/03/2024 9.3  8.6 - 10.3 mg/dL Final   Total Protein 54/07/8118 6.9  6.1 - 8.1 g/dL Final   Albumin 14/78/2956 4.2  3.6 - 5.1 g/dL Final   Globulin 21/30/8657 2.7  1.9 - 3.7 g/dL (calc) Final   AG Ratio 02/03/2024 1.6  1.0 - 2.5 (calc) Final   Total Bilirubin 02/03/2024 0.5  0.2 - 1.2 mg/dL Final   Alkaline phosphatase (APISO) 02/03/2024 56  35 - 144 U/L Final   AST 02/03/2024  12  10 - 35 U/L Final   ALT 02/03/2024 11  9 - 46 U/L Final   Anti Nuclear Antibody (ANA) 02/03/2024 NEGATIVE  NEGATIVE Final   Comment: ANA IFA is a first line screen for detecting the presence of up to approximately 150 autoantibodies in various autoimmune diseases. A negative ANA IFA result suggests an ANA-associated autoimmune disease is not present at this time, but is not definitive. If there is high clinical suspicion for Sjogren's syndrome, testing for anti-SS-A/Ro antibody should be considered. Anti-Jo-1 antibody should be considered for clinically suspected inflammatory myopathies. . AC-0: Negative . International Consensus on ANA Patterns (SeverTies.uy) . For additional information, please refer to http://education.[facility].com/faq/FAQ177 (This link is being provided for informational/ educational purposes only.) .    Sed Rate 02/03/2024 2  0 - 20 mm/h Final   ANCA SCREEN 02/03/2024 Negative  Negative Final   Comment: . ANCA screen uses indirect immunofluorescence to detect antibodies to neutrophil cytoplasmic antigens. A positive screen reflexes to titer and pattern. Patterns include cytoplasmic (c-ANCA) and perinuclear (p-ANCA) both of which are associated with vasculitis, and atypical p-ANCA which is associated with inflammatory bowel disease and other disorders. .    Fortunately, all of his blood work showed no evidence of any systemic involvement.  Right after I saw the patient, he developed loose stool.  He denies any bloody diarrhea.  He denies any fevers.  His watery diarrhea.  He also reports that his grandchildren and daughter have similar symptoms.  He denies any cough or hemoptysis or chest pain.  He denies any hematuria.  He denies any hematochezia or melena. Past Medical History:  Diagnosis Date   Ascending aortic aneurysm (HCC)    4 cm 2019   Colon polyp    tubular adenoma 01/2018   GERD (gastroesophageal reflux  disease)    Hiatal hernia    HLD (hyperlipidemia)    Inguinal hernia    Prediabetes    Pulmonary emboli (HCC)    Pulmonary emboli (HCC)    Past Surgical History:  Procedure Laterality Date   HERNIA REPAIR     Current Outpatient Medications on File Prior to Visit  Medication Sig Dispense Refill   albuterol (VENTOLIN HFA) 108 (90 Base) MCG/ACT inhaler TAKE 2 PUFFS BY MOUTH EVERY 6 HOURS AS NEEDED FOR WHEEZE OR SHORTNESS OF BREATH 8.5 each 2   albuterol (VENTOLIN HFA) 108 (90 Base) MCG/ACT inhaler Inhale  2 puffs into the lungs every 6 (six) hours as needed for wheezing or shortness of breath. 8 g 1   meloxicam (MOBIC) 15 MG tablet TAKE 1 TABLET (15 MG TOTAL) BY MOUTH DAILY. 90 tablet 0   omeprazole (PRILOSEC) 20 MG capsule Take 1 capsule (20 mg total) by mouth daily. 90 capsule 3   predniSONE (DELTASONE) 20 MG tablet 3 tabs poqday 1-2, 2 tabs poqday 3-4, 1 tab poqday 5-6 12 tablet 0   Probiotic Product (PROBIOTIC DAILY PO) Take 1 tablet by mouth daily.     rosuvastatin (CRESTOR) 10 MG tablet Take 1 tablet (10 mg total) by mouth daily. 90 tablet 3   tamsulosin (FLOMAX) 0.4 MG CAPS capsule Take 1 capsule (0.4 mg total) by mouth daily. 30 capsule 3   vitamin E 400 UNIT capsule Take 400 Units by mouth daily.     No current facility-administered medications on file prior to visit.   No Known Allergies Social History   Socioeconomic History   Marital status: Married    Spouse name: Gavin Pound   Number of children: Not on file   Years of education: Not on file   Highest education level: Not on file  Occupational History   Not on file  Tobacco Use   Smoking status: Never   Smokeless tobacco: Never  Substance and Sexual Activity   Alcohol use: No   Drug use: No   Sexual activity: Yes  Other Topics Concern   Not on file  Social History Narrative   Married with 2 children   Right handed   12 th    3 cups daily   Social Drivers of Health   Financial Resource Strain: Low Risk   (10/10/2023)   Overall Financial Resource Strain (CARDIA)    Difficulty of Paying Living Expenses: Not hard at all  Food Insecurity: No Food Insecurity (10/10/2023)   Hunger Vital Sign    Worried About Running Out of Food in the Last Year: Never true    Ran Out of Food in the Last Year: Never true  Transportation Needs: No Transportation Needs (10/10/2023)   PRAPARE - Administrator, Civil Service (Medical): No    Lack of Transportation (Non-Medical): No  Physical Activity: Insufficiently Active (10/10/2023)   Exercise Vital Sign    Days of Exercise per Week: 3 days    Minutes of Exercise per Session: 30 min  Stress: No Stress Concern Present (10/10/2023)   [employer] of Occupational Health - Occupational Stress Questionnaire    Feeling of Stress : Not at all  Social Connections: Socially Integrated (10/10/2023)   Social Connection and Isolation Panel [NHANES]    Frequency of Communication with Friends and Family: More than three times a week    Frequency of Social Gatherings with Friends and Family: More than three times a week    Attends Religious Services: More than 4 times per year    Active Member of [employer] or Organizations: Yes    Attends Banker Meetings: More than 4 times per year    Marital Status: Married  Catering manager Violence: Not At Risk (10/10/2023)   Humiliation, Afraid, Rape, and Kick questionnaire    Fear of Current or Ex-Partner: No    Emotionally Abused: No    Physically Abused: No    Sexually Abused: No      Review of Systems  Skin:  Positive for rash.  All other systems reviewed and are negative.  Objective:   Physical Exam Constitutional:      General: He is not in acute distress.    Appearance: Normal appearance. He is not ill-appearing, toxic-appearing or diaphoretic.  Cardiovascular:     Rate and Rhythm: Normal rate and regular rhythm.     Heart sounds: Normal heart sounds. No murmur heard.    No friction  rub. No gallop.  Pulmonary:     Effort: Pulmonary effort is normal. No respiratory distress.     Breath sounds: Normal air entry. No stridor, decreased air movement or transmitted upper airway sounds. No decreased breath sounds, wheezing, rhonchi or rales.  Chest:     Chest wall: No deformity, swelling, tenderness, crepitus or edema. There is no dullness to percussion.  Musculoskeletal:     Right lower leg: No edema.     Left lower leg: No edema.  Lymphadenopathy:     Cervical: No cervical adenopathy.  Neurological:     Mental Status: He is alert.           Assessment & Plan:  Gastroenteritis Patient now has symptoms of viral gastroenteritis.  I believe the initial rash was simply the herald announcing the arrival of the virus.  There is no evidence of any systemic vasculitis.  I anticipate self-limited resolution of symptoms by Monday or Tuesday.  Use Imodium for diarrhea and push Gatorade.  He can also use a probiotic.  If symptoms or not improving by first of next week, I would recommend stool cultures

## 2024-06-18 DIAGNOSIS — D225 Melanocytic nevi of trunk: Secondary | ICD-10-CM | POA: Diagnosis not present

## 2024-06-18 DIAGNOSIS — D1801 Hemangioma of skin and subcutaneous tissue: Secondary | ICD-10-CM | POA: Diagnosis not present

## 2024-06-18 DIAGNOSIS — L812 Freckles: Secondary | ICD-10-CM | POA: Diagnosis not present

## 2024-06-18 DIAGNOSIS — D2262 Melanocytic nevi of left upper limb, including shoulder: Secondary | ICD-10-CM | POA: Diagnosis not present

## 2024-06-18 DIAGNOSIS — L570 Actinic keratosis: Secondary | ICD-10-CM | POA: Diagnosis not present

## 2024-06-18 DIAGNOSIS — L821 Other seborrheic keratosis: Secondary | ICD-10-CM | POA: Diagnosis not present

## 2024-06-18 DIAGNOSIS — D2272 Melanocytic nevi of left lower limb, including hip: Secondary | ICD-10-CM | POA: Diagnosis not present

## 2024-07-25 DIAGNOSIS — H5203 Hypermetropia, bilateral: Secondary | ICD-10-CM | POA: Diagnosis not present

## 2024-07-25 DIAGNOSIS — H2513 Age-related nuclear cataract, bilateral: Secondary | ICD-10-CM | POA: Diagnosis not present

## 2024-08-21 ENCOUNTER — Ambulatory Visit (INDEPENDENT_AMBULATORY_CARE_PROVIDER_SITE_OTHER)

## 2024-08-21 DIAGNOSIS — Z1211 Encounter for screening for malignant neoplasm of colon: Secondary | ICD-10-CM | POA: Insufficient documentation

## 2024-08-21 DIAGNOSIS — R1310 Dysphagia, unspecified: Secondary | ICD-10-CM | POA: Insufficient documentation

## 2024-08-21 DIAGNOSIS — Z23 Encounter for immunization: Secondary | ICD-10-CM | POA: Diagnosis not present

## 2024-08-21 DIAGNOSIS — R1013 Epigastric pain: Secondary | ICD-10-CM | POA: Insufficient documentation

## 2024-08-21 DIAGNOSIS — Z860100 Personal history of colon polyps, unspecified: Secondary | ICD-10-CM | POA: Insufficient documentation

## 2024-08-21 DIAGNOSIS — Z7689 Persons encountering health services in other specified circumstances: Secondary | ICD-10-CM | POA: Insufficient documentation

## 2024-08-21 DIAGNOSIS — R7303 Prediabetes: Secondary | ICD-10-CM | POA: Insufficient documentation

## 2024-08-21 DIAGNOSIS — J309 Allergic rhinitis, unspecified: Secondary | ICD-10-CM | POA: Insufficient documentation

## 2024-08-21 DIAGNOSIS — K439 Ventral hernia without obstruction or gangrene: Secondary | ICD-10-CM | POA: Insufficient documentation

## 2024-08-21 DIAGNOSIS — K469 Unspecified abdominal hernia without obstruction or gangrene: Secondary | ICD-10-CM | POA: Insufficient documentation

## 2024-08-21 DIAGNOSIS — N400 Enlarged prostate without lower urinary tract symptoms: Secondary | ICD-10-CM | POA: Insufficient documentation

## 2024-10-24 ENCOUNTER — Ambulatory Visit: Admitting: Family Medicine

## 2024-10-24 ENCOUNTER — Encounter: Payer: Self-pay | Admitting: Family Medicine

## 2024-10-24 ENCOUNTER — Ambulatory Visit: Payer: Self-pay

## 2024-10-24 VITALS — BP 128/78 | HR 79 | Temp 97.7°F | Ht 70.0 in | Wt 218.8 lb

## 2024-10-24 DIAGNOSIS — K219 Gastro-esophageal reflux disease without esophagitis: Secondary | ICD-10-CM

## 2024-10-24 DIAGNOSIS — Z791 Long term (current) use of non-steroidal anti-inflammatories (NSAID): Secondary | ICD-10-CM | POA: Diagnosis not present

## 2024-10-24 DIAGNOSIS — I7121 Aneurysm of the ascending aorta, without rupture: Secondary | ICD-10-CM | POA: Diagnosis not present

## 2024-10-24 NOTE — Progress Notes (Unsigned)
 Patient Office Visit  Assessment & Plan:  Gastroesophageal reflux disease without esophagitis  NSAID long-term use  Aneurysm of ascending aorta without rupture -     CT ANGIO CHEST AORTA W/CM & OR WO/CM; Future   Assessment and Plan    Gastroesophageal reflux disease Chronic GERD with recent exacerbation, possibly worsened by meloxicam . No dysphagia or red flags for malignancy. Current omeprazole  regimen may be suboptimal. - Increase omeprazole  to 40 mg daily for one week. - Take omeprazole  30-60 minutes before meals. - Discontinue meloxicam  and switch to Tylenol  for pain management. - Provided educational materials on reflux management.  Ascending aortic aneurysm 4 cm ascending aortic aneurysm, stable with no recent changes. Annual surveillance required. - Ordered CT angiogram of the aorta for surveillance. - Coordinated with Dr. Charlena for follow-up and management.  Chronic hip and back pain Chronic hip and back pain, possibly arthritis-related. NSAID use not ideal due to GERD. - Discontinue meloxicam  and switch to Tylenol  for pain management. - Consider Voltaren  gel for localized pain relief. - Discuss potential imaging with Dr. Charlena to evaluate hip and back pain.     Will increase omeprazole  20 mg twice a day.  Alternatively we could change it to 40 mg once a day.  GERD precautions reviewed and information given to the patient.  If worsening symptoms or no improvement he will notify us .  Return if symptoms worsen or fail to improve.   Subjective:    Patient ID: Howard Davenport, male    DOB: February 01, 1946  Age: 78 y.o. MRN: [id number]  Chief Complaint  Patient presents with   Nausea    Pt c/o nausea and burning sensation in chest. Pt also c/o feeling lightheaded.     HPI Discussed the use of AI scribe software for clinical note transcription with the patient, who gave verbal consent to proceed.  History of Present Illness   Howard Davenport is a 78 year old male with  esophageal strictures and GERD who presents with concerns about pancreatic cancer and worsening reflux symptoms.  He is concerned about the possibility of pancreatic cancer and reports having several friends who have had the condition. He has not experienced significant weight loss, nausea, or vomiting, and his weight has remained stable.  He has a history of esophageal strictures for which he has undergone dilation procedures in the past, although it has been years since the last one. He takes 20 mg of omeprazole  daily for GERD but sometimes takes it while eating rather than 30-60 minutes before meals. He reports a sensation similar to an ulcer in the scar ring of his esophagus over the past two to three days.  He has been taking meloxicam  recently for hip and back pain, which alleviates his symptoms but may be contributing to his reflux. He has not experienced difficulty swallowing, choking, or significant nighttime symptoms. He reports occasional, brief episodes of nausea lasting two to three seconds, which he associates with certain activities.  He enjoys spicy foods but consumes them infrequently, about once every two to three weeks when dining out. He does not smoke or chew tobacco and reports eating dinner around 6 PM, with occasional snacks like ice cream later in the evening. He does not eat quickly or frequently eat in the car.  He has a history of an aneurysm, which was last checked in March of the previous year. He has a history of blood clots in the lungs, which were attributed to COVID-19, and he  has had COVID-19 four times.     History of Present Illness Howard Davenport is a 78 year old male with esophageal strictures and GERD who presents with concerns about pancreatic cancer and worsening reflux symptoms.  He is concerned about the possibility of pancreatic cancer and reports having several friends who have had the condition. He has not experienced significant weight loss, nausea, or  vomiting, and his weight has remained stable.  He has a history of esophageal strictures for which he has undergone dilation procedures in the past, although it has been years since the last one. He takes 20 mg of omeprazole  daily for GERD but sometimes takes it while eating rather than 30-60 minutes before meals. He reports a sensation similar to an ulcer in the scar ring of his esophagus over the past two to three days.  He has been taking meloxicam  recently for hip and back pain, which alleviates his symptoms but may be contributing to his reflux. He has not experienced difficulty swallowing, choking, or significant nighttime symptoms. He reports occasional, brief episodes of nausea lasting two to three seconds, which he associates with certain activities.  He enjoys spicy foods but consumes them infrequently, about once every two to three weeks when dining out. He does not smoke or chew tobacco and reports eating dinner around 6 PM, with occasional snacks like ice cream later in the evening. He does not eat quickly or frequently eat in the car.  He has a history of an aortic aneurysm, which was last checked in March of the previous year. He has a history of blood clots in the lungs, which were attributed to COVID-19, and he has had COVID-19 four times.  Results RADIOLOGY Aortic Aneurysm CT Angiogram: Stable, 4 cm (01/2023)  Assessment and Plan Gastroesophageal reflux disease Chronic GERD with recent exacerbation, possibly worsened by meloxicam . No dysphagia or red flags for malignancy. Current omeprazole  regimen may be suboptimal. - Increase omeprazole  to 40 mg daily for one week. - Take omeprazole  30-60 minutes before meals. - Discontinue meloxicam  and switch to Tylenol  for pain management. - Provided educational materials on reflux management.  Ascending aortic aneurysm 4 cm ascending aortic aneurysm, stable with no recent changes. Annual surveillance required. - Ordered CT angiogram of  the aorta for surveillance. - Coordinated with Dr. Duanne for follow-up and management.  Chronic hip and back pain Chronic hip and back pain, possibly arthritis-related. NSAID use not ideal due to GERD. - Discontinue meloxicam  and switch to Tylenol  for pain management. - Consider Voltaren  gel for localized pain relief. - Discuss potential imaging with Dr. Duanne to evaluate hip and back pain.    The 10-year ASCVD risk score (Arnett DK, et al., 2019) is: 28.8%  Past Medical History:  Diagnosis Date   Ascending aortic aneurysm    4 cm 2019   Colon polyp    tubular adenoma 01/2018   GERD (gastroesophageal reflux disease)    Hiatal hernia    HLD (hyperlipidemia)    Inguinal hernia    Prediabetes    Pulmonary emboli (HCC)    Pulmonary emboli (HCC)    Past Surgical History:  Procedure Laterality Date   HERNIA REPAIR     Social History   Tobacco Use   Smoking status: Never   Smokeless tobacco: Never  Substance Use Topics   Alcohol use: No   Drug use: No   Family History  Problem Relation Age of Onset   Leukemia Mother    Heart disease  Father    No Known Allergies  ROS    Objective:    BP 128/78   Pulse 79   Temp 97.7 F (36.5 C)   Ht 5' 10 (1.778 m)   Wt 218 lb 12.8 oz (99.2 kg)   SpO2 99%   BMI 31.39 kg/m  BP Readings from Last 3 Encounters:  10/24/24 128/78  02/10/24 124/68  02/03/24 122/72   Wt Readings from Last 3 Encounters:  10/24/24 218 lb 12.8 oz (99.2 kg)  02/10/24 218 lb (98.9 kg)  02/03/24 222 lb 6.4 oz (100.9 kg)    Physical Exam Vitals and nursing note reviewed.  Constitutional:      General: He is not in acute distress.    Appearance: Normal appearance.  HENT:     Head: Normocephalic.     Right Ear: Tympanic membrane, ear canal and external ear normal.     Left Ear: Tympanic membrane, ear canal and external ear normal.  Eyes:     Extraocular Movements: Extraocular movements intact.     Conjunctiva/sclera: Conjunctivae normal.      Pupils: Pupils are equal, round, and reactive to light.  Cardiovascular:     Rate and Rhythm: Normal rate and regular rhythm.     Heart sounds: Normal heart sounds.  Pulmonary:     Effort: Pulmonary effort is normal.     Breath sounds: Normal breath sounds.  Musculoskeletal:     Right lower leg: No edema.     Left lower leg: No edema.  Neurological:     General: No focal deficit present.     Mental Status: He is alert and oriented to person, place, and time.  Psychiatric:        Mood and Affect: Mood normal.        Behavior: Behavior normal.        Thought Content: Thought content normal.        Judgment: Judgment normal.      No results found for any visits on 10/24/24.  {Labs (Optional):23779}

## 2024-10-24 NOTE — Telephone Encounter (Signed)
 FYI Only or Action Required?: FYI only for provider: appointment scheduled on 10/24/24.  Patient was last seen in primary care on 02/10/2024 by Duanne Butler DASEN, MD.  Called Nurse Triage reporting Nausea.  Symptoms began several days ago.  Interventions attempted: OTC medications: Mylanta.  Symptoms are: stable.  Triage Disposition: See Physician Within 24 Hours (overriding See PCP When Office is Open (Within 3 Days))  Patient/caregiver understands and will follow disposition?: Yes   Copied from CRM #[id number]. Topic: Clinical - Red Word Triage >> Oct 24, 2024  2:04 PM Rachelle R wrote: Red Word that prompted transfer to Nurse Triage: Patient states that he has this burning sensation in his throat for a few days, but started getting nauseous today. Reason for Disposition  Nausea lasts > 1 week    Began today, associated with additional symptoms  Answer Assessment - Initial Assessment Questions Pt reports he has been experiencing mild esophageal burning x 3 days, notes mild fleeting nausea that began today and what he states isn't really feeling lightheaded I just feel off for a minute. Denies CP, SOB, vision or speech changes, HA, N/V, unusual sweating, numbness or weakness to one side of face or body, unsteady gait. This RN educated pt on home care, new-worsening symptoms, when to call back/seek emergent care. Pt verbalized understanding and agrees to plan.    1. NAUSEA SEVERITY: How bad is the nausea? (e.g., mild, moderate, severe; dehydration, weight loss)     Very mild, fleeting 2. ONSET: When did the nausea begin?     Today, proceeded by burning sensation in throat 3. VOMITING: Any vomiting? If Yes, ask: How many times today?     None 4. RECURRENT SYMPTOM: Have you had nausea before? If Yes, ask: When was the last time? What happened that time?     Pt reports history of GERD, has had esophagus previously stretched, notes Mylanta usually resolves symptoms  temporarily 5. CAUSE: What do you think is causing the nausea?     Unknown  Protocols used: Nausea-A-AH

## 2024-11-06 ENCOUNTER — Ambulatory Visit
Admission: RE | Admit: 2024-11-06 | Discharge: 2024-11-06 | Disposition: A | Discharge status: Home / Self Care | Source: Ambulatory Visit | Attending: Family Medicine | Admitting: Family Medicine

## 2024-11-06 DIAGNOSIS — I7121 Aneurysm of the ascending aorta, without rupture: Secondary | ICD-10-CM

## 2024-11-06 MED ORDER — IOPAMIDOL (ISOVUE-370) INJECTION 76%
75.0000 mL | Freq: Once | INTRAVENOUS | Status: AC | PRN
  Administered 2024-11-06: 75 mL via INTRAVENOUS

## 2024-11-09 ENCOUNTER — Ambulatory Visit: Payer: Self-pay | Admitting: Family Medicine

## 2024-11-14 ENCOUNTER — Other Ambulatory Visit: Payer: Self-pay | Admitting: Family Medicine

## 2024-11-27 ENCOUNTER — Telehealth: Payer: Self-pay | Admitting: Family Medicine

## 2024-11-27 ENCOUNTER — Other Ambulatory Visit: Payer: Self-pay

## 2024-11-27 DIAGNOSIS — R0600 Dyspnea, unspecified: Secondary | ICD-10-CM

## 2024-11-27 MED ORDER — ALBUTEROL SULFATE HFA 108 (90 BASE) MCG/ACT IN AERS: 2.0000 | INHALATION_SPRAY | Freq: Four times a day (QID) | RESPIRATORY_TRACT | 1 refills | Status: AC | PRN

## 2024-11-27 NOTE — Telephone Encounter (Unsigned)
 Copied from CRM [id number]. Topic: Clinical - Medication Refill >> Nov 27, 2024  2:29 PM Nathanel BROCKS wrote: Medication: albuterol  (VENTOLIN  HFA) 108 (90 Base) MCG/ACT inhaler  Has the patient contacted their pharmacy? No  This is the patient's preferred pharmacy:  [id number] [place], [place] - [address] [place] [address] Phone: [phone] Fax: [phone]  Is this the correct pharmacy for this prescription? Yes If no, delete pharmacy and type the correct one.   Has the prescription been filled recently? No  Is the patient out of the medication? Yes  Has the patient been seen for an appointment in the last year OR does the patient have an upcoming appointment? Yes  Can we respond through MyChart? No  Agent: Please be advised that Rx refills may take up to 3 business days. We ask that you follow-up with your pharmacy.
